# Patient Record
Sex: Female | Born: 1945 | Race: Black or African American | Hispanic: No | Marital: Married | Smoking: Never smoker
Health system: Southern US, Community
[De-identification: ages and names within clinical notes are randomized; demographics above are authoritative.]

## PROBLEM LIST (undated history)

## (undated) DIAGNOSIS — A048 Other specified bacterial intestinal infections: Secondary | ICD-10-CM

## (undated) DIAGNOSIS — C50919 Malignant neoplasm of unspecified site of unspecified female breast: Secondary | ICD-10-CM

## (undated) DIAGNOSIS — C50219 Malignant neoplasm of upper-inner quadrant of unspecified female breast: Secondary | ICD-10-CM

## (undated) DIAGNOSIS — K589 Irritable bowel syndrome without diarrhea: Secondary | ICD-10-CM

## (undated) DIAGNOSIS — K219 Gastro-esophageal reflux disease without esophagitis: Secondary | ICD-10-CM

## (undated) DIAGNOSIS — Z923 Personal history of irradiation: Secondary | ICD-10-CM

## (undated) DIAGNOSIS — C801 Malignant (primary) neoplasm, unspecified: Secondary | ICD-10-CM

## (undated) DIAGNOSIS — E785 Hyperlipidemia, unspecified: Secondary | ICD-10-CM

## (undated) DIAGNOSIS — R011 Cardiac murmur, unspecified: Secondary | ICD-10-CM

## (undated) DIAGNOSIS — N63 Unspecified lump in unspecified breast: Secondary | ICD-10-CM

## (undated) DIAGNOSIS — E559 Vitamin D deficiency, unspecified: Secondary | ICD-10-CM

## (undated) DIAGNOSIS — C50419 Malignant neoplasm of upper-outer quadrant of unspecified female breast: Secondary | ICD-10-CM

## (undated) DIAGNOSIS — Z8719 Personal history of other diseases of the digestive system: Secondary | ICD-10-CM

## (undated) DIAGNOSIS — Z9221 Personal history of antineoplastic chemotherapy: Secondary | ICD-10-CM

## (undated) DIAGNOSIS — M810 Age-related osteoporosis without current pathological fracture: Secondary | ICD-10-CM

## (undated) DIAGNOSIS — I10 Essential (primary) hypertension: Secondary | ICD-10-CM

## (undated) HISTORY — PX: HERNIA REPAIR: SHX51

## (undated) HISTORY — DX: Essential (primary) hypertension: I10

## (undated) HISTORY — DX: Cardiac murmur, unspecified: R01.1

## (undated) HISTORY — DX: Malignant neoplasm of upper-outer quadrant of unspecified female breast: C50.419

## (undated) HISTORY — PX: TUBAL LIGATION: SHX77

## (undated) HISTORY — PX: COLONOSCOPY: SHX174

## (undated) HISTORY — DX: Malignant neoplasm of upper-inner quadrant of unspecified female breast: C50.219

## (undated) HISTORY — PX: CHOLECYSTECTOMY: SHX55

## (undated) HISTORY — DX: Unspecified lump in unspecified breast: N63.0

## (undated) HISTORY — DX: Other specified bacterial intestinal infections: A04.8

## (undated) HISTORY — PX: BREAST EXCISIONAL BIOPSY: SUR124

## (undated) HISTORY — DX: Gastro-esophageal reflux disease without esophagitis: K21.9

## (undated) HISTORY — DX: Malignant (primary) neoplasm, unspecified: C80.1

---

## 1968-10-28 HISTORY — PX: DILATION AND CURETTAGE OF UTERUS: SHX78

## 2003-10-29 HISTORY — PX: UPPER GI ENDOSCOPY: SHX6162

## 2004-08-28 ENCOUNTER — Ambulatory Visit: Payer: Self-pay | Admitting: Gastroenterology

## 2005-04-02 ENCOUNTER — Ambulatory Visit: Payer: Self-pay | Admitting: Internal Medicine

## 2006-04-24 ENCOUNTER — Ambulatory Visit: Payer: Self-pay | Admitting: Internal Medicine

## 2006-12-13 ENCOUNTER — Ambulatory Visit: Payer: Self-pay | Admitting: Otolaryngology

## 2007-04-27 ENCOUNTER — Ambulatory Visit: Payer: Self-pay | Admitting: Internal Medicine

## 2007-09-11 ENCOUNTER — Ambulatory Visit: Payer: Self-pay | Admitting: Gastroenterology

## 2008-04-27 ENCOUNTER — Ambulatory Visit: Payer: Self-pay | Admitting: Unknown Physician Specialty

## 2008-04-27 HISTORY — PX: UPPER GI ENDOSCOPY: SHX6162

## 2008-04-28 ENCOUNTER — Ambulatory Visit: Payer: Self-pay | Admitting: Internal Medicine

## 2009-05-04 ENCOUNTER — Ambulatory Visit: Payer: Self-pay | Admitting: Internal Medicine

## 2009-08-29 ENCOUNTER — Ambulatory Visit: Payer: Self-pay | Admitting: Internal Medicine

## 2009-10-28 DIAGNOSIS — Z923 Personal history of irradiation: Secondary | ICD-10-CM

## 2009-10-28 DIAGNOSIS — C50919 Malignant neoplasm of unspecified site of unspecified female breast: Secondary | ICD-10-CM

## 2009-10-28 DIAGNOSIS — Z9221 Personal history of antineoplastic chemotherapy: Secondary | ICD-10-CM

## 2009-10-28 DIAGNOSIS — C801 Malignant (primary) neoplasm, unspecified: Secondary | ICD-10-CM

## 2009-10-28 HISTORY — DX: Personal history of irradiation: Z92.3

## 2009-10-28 HISTORY — DX: Malignant neoplasm of unspecified site of unspecified female breast: C50.919

## 2009-10-28 HISTORY — PX: BREAST SURGERY: SHX581

## 2009-10-28 HISTORY — PX: BREAST BIOPSY: SHX20

## 2009-10-28 HISTORY — DX: Malignant (primary) neoplasm, unspecified: C80.1

## 2009-10-28 HISTORY — DX: Personal history of antineoplastic chemotherapy: Z92.21

## 2010-07-24 ENCOUNTER — Ambulatory Visit: Payer: Self-pay | Admitting: Internal Medicine

## 2010-07-26 ENCOUNTER — Ambulatory Visit: Payer: Self-pay | Admitting: Internal Medicine

## 2010-07-30 ENCOUNTER — Ambulatory Visit: Payer: Self-pay | Admitting: Internal Medicine

## 2010-09-04 ENCOUNTER — Ambulatory Visit: Payer: Self-pay | Admitting: General Surgery

## 2010-10-04 ENCOUNTER — Ambulatory Visit: Payer: Self-pay | Admitting: General Surgery

## 2010-10-04 DIAGNOSIS — C50219 Malignant neoplasm of upper-inner quadrant of unspecified female breast: Secondary | ICD-10-CM

## 2010-10-04 HISTORY — PX: BREAST LUMPECTOMY: SHX2

## 2010-10-04 HISTORY — DX: Malignant neoplasm of upper-inner quadrant of unspecified female breast: C50.219

## 2010-10-05 LAB — PATHOLOGY REPORT

## 2010-10-15 ENCOUNTER — Ambulatory Visit: Payer: Self-pay | Admitting: Internal Medicine

## 2010-10-17 ENCOUNTER — Ambulatory Visit: Payer: Self-pay | Admitting: General Surgery

## 2010-10-28 ENCOUNTER — Ambulatory Visit: Payer: Self-pay | Admitting: Internal Medicine

## 2010-10-28 HISTORY — PX: OTHER SURGICAL HISTORY: SHX169

## 2010-11-12 ENCOUNTER — Ambulatory Visit: Payer: Self-pay | Admitting: General Surgery

## 2010-11-28 ENCOUNTER — Ambulatory Visit: Payer: Self-pay | Admitting: Internal Medicine

## 2010-12-27 ENCOUNTER — Ambulatory Visit: Payer: Self-pay | Admitting: Internal Medicine

## 2011-01-27 ENCOUNTER — Ambulatory Visit: Payer: Self-pay | Admitting: Internal Medicine

## 2011-02-26 ENCOUNTER — Ambulatory Visit: Payer: Self-pay | Admitting: Internal Medicine

## 2011-03-29 ENCOUNTER — Ambulatory Visit: Payer: Self-pay | Admitting: Internal Medicine

## 2011-04-28 ENCOUNTER — Ambulatory Visit: Payer: Self-pay | Admitting: Internal Medicine

## 2011-05-29 ENCOUNTER — Ambulatory Visit: Payer: Self-pay | Admitting: Internal Medicine

## 2011-07-02 ENCOUNTER — Ambulatory Visit: Payer: Self-pay | Admitting: Internal Medicine

## 2011-07-05 ENCOUNTER — Ambulatory Visit: Payer: Self-pay | Admitting: Unknown Physician Specialty

## 2011-07-29 ENCOUNTER — Ambulatory Visit: Payer: Self-pay | Admitting: Internal Medicine

## 2011-07-30 ENCOUNTER — Ambulatory Visit: Payer: Self-pay | Admitting: General Surgery

## 2011-08-29 ENCOUNTER — Ambulatory Visit: Payer: Self-pay | Admitting: Internal Medicine

## 2011-09-23 LAB — CANCER ANTIGEN 27.29: CA 27.29: 30.5 U/mL (ref 0.0–38.6)

## 2011-09-28 ENCOUNTER — Ambulatory Visit: Payer: Self-pay | Admitting: Internal Medicine

## 2011-11-01 ENCOUNTER — Ambulatory Visit: Payer: Self-pay | Admitting: Internal Medicine

## 2011-11-29 ENCOUNTER — Ambulatory Visit: Payer: Self-pay | Admitting: Internal Medicine

## 2011-12-27 ENCOUNTER — Ambulatory Visit: Payer: Self-pay | Admitting: Internal Medicine

## 2012-01-27 ENCOUNTER — Ambulatory Visit: Payer: Self-pay | Admitting: Internal Medicine

## 2012-01-27 HISTORY — PX: PORT-A-CATH REMOVAL: SHX5289

## 2012-01-27 LAB — HEPATIC FUNCTION PANEL A (ARMC)
Albumin: 3.8 g/dL (ref 3.4–5.0)
Alkaline Phosphatase: 113 U/L (ref 50–136)
Bilirubin, Direct: 0.1 mg/dL (ref 0.00–0.20)
Bilirubin,Total: 0.3 mg/dL (ref 0.2–1.0)
SGOT(AST): 24 U/L (ref 15–37)
SGPT (ALT): 27 U/L
Total Protein: 7.3 g/dL (ref 6.4–8.2)

## 2012-01-27 LAB — CBC CANCER CENTER
Basophil %: 1 %
Eosinophil #: 0 x10 3/mm (ref 0.0–0.7)
Eosinophil %: 0.4 %
HCT: 39.4 % (ref 35.0–47.0)
HGB: 13.6 g/dL (ref 12.0–16.0)
MCH: 31.1 pg (ref 26.0–34.0)
MCHC: 34.4 g/dL (ref 32.0–36.0)
Monocyte #: 0.5 x10 3/mm (ref 0.0–0.7)
Monocyte %: 9 %
Neutrophil #: 3.4 x10 3/mm (ref 1.4–6.5)
Neutrophil %: 61.8 %
Platelet: 203 x10 3/mm (ref 150–440)

## 2012-01-27 LAB — CREATININE, SERUM: Creatinine: 0.75 mg/dL (ref 0.60–1.30)

## 2012-01-28 LAB — CANCER ANTIGEN 27.29: CA 27.29: 29.4 U/mL (ref 0.0–38.6)

## 2012-01-30 ENCOUNTER — Ambulatory Visit: Payer: Self-pay | Admitting: General Surgery

## 2012-02-26 ENCOUNTER — Ambulatory Visit: Payer: Self-pay | Admitting: Internal Medicine

## 2012-07-13 ENCOUNTER — Ambulatory Visit: Payer: Self-pay | Admitting: Internal Medicine

## 2012-07-13 LAB — CBC CANCER CENTER
Basophil #: 0 x10 3/mm (ref 0.0–0.1)
Basophil %: 0.6 %
Eosinophil #: 0 x10 3/mm (ref 0.0–0.7)
Eosinophil %: 0.7 %
HCT: 42.8 % (ref 35.0–47.0)
HGB: 13.8 g/dL (ref 12.0–16.0)
Lymphocyte %: 30.4 %
MCH: 29.8 pg (ref 26.0–34.0)
MCHC: 32.2 g/dL (ref 32.0–36.0)
MCV: 92 fL (ref 80–100)
Monocyte %: 8.8 %
Neutrophil #: 3 x10 3/mm (ref 1.4–6.5)
RBC: 4.64 10*6/uL (ref 3.80–5.20)
RDW: 14.6 % — ABNORMAL HIGH (ref 11.5–14.5)
WBC: 5.1 x10 3/mm (ref 3.6–11.0)

## 2012-07-13 LAB — HEPATIC FUNCTION PANEL A (ARMC)
Albumin: 4 g/dL (ref 3.4–5.0)
Alkaline Phosphatase: 116 U/L (ref 50–136)
SGOT(AST): 29 U/L (ref 15–37)
SGPT (ALT): 33 U/L (ref 12–78)

## 2012-07-13 LAB — CREATININE, SERUM
EGFR (African American): >60
EGFR (Non-African Amer.): >60

## 2012-07-14 LAB — CANCER ANTIGEN 27.29: CA 27.29: 35.5 U/mL (ref 0.0–38.6)

## 2012-07-28 ENCOUNTER — Ambulatory Visit: Payer: Self-pay | Admitting: Internal Medicine

## 2012-08-03 ENCOUNTER — Ambulatory Visit: Payer: Self-pay | Admitting: General Surgery

## 2013-02-08 ENCOUNTER — Ambulatory Visit: Payer: Self-pay | Admitting: Internal Medicine

## 2013-02-10 LAB — CBC CANCER CENTER
Basophil %: 0.6 %
Eosinophil #: 0 x10 3/mm (ref 0.0–0.7)
HCT: 42.5 % (ref 35.0–47.0)
HGB: 14.2 g/dL (ref 12.0–16.0)
Lymphocyte #: 2 x10 3/mm (ref 1.0–3.6)
Lymphocyte %: 31.6 %
MCH: 30 pg (ref 26.0–34.0)
MCHC: 33.3 g/dL (ref 32.0–36.0)
MCV: 90 fL (ref 80–100)
Monocyte #: 0.6 x10 3/mm (ref 0.2–0.9)
Neutrophil #: 3.7 x10 3/mm (ref 1.4–6.5)
Neutrophil %: 58.2 %
Platelet: 227 x10 3/mm (ref 150–440)
RBC: 4.72 10*6/uL (ref 3.80–5.20)
RDW: 14.2 % (ref 11.5–14.5)

## 2013-02-10 LAB — HEPATIC FUNCTION PANEL A (ARMC)
Albumin: 4 g/dL (ref 3.4–5.0)
Alkaline Phosphatase: 125 U/L (ref 50–136)
SGPT (ALT): 27 U/L (ref 12–78)
Total Protein: 8.2 g/dL (ref 6.4–8.2)

## 2013-02-10 LAB — CREATININE, SERUM
Creatinine: 1.02 mg/dL (ref 0.60–1.30)
EGFR (Non-African Amer.): 58 — ABNORMAL LOW

## 2013-02-25 ENCOUNTER — Ambulatory Visit: Payer: Self-pay | Admitting: Internal Medicine

## 2013-03-29 ENCOUNTER — Encounter: Payer: Self-pay | Admitting: *Deleted

## 2013-05-09 ENCOUNTER — Ambulatory Visit: Payer: Self-pay | Admitting: Family Medicine

## 2013-05-09 ENCOUNTER — Ambulatory Visit: Payer: Self-pay | Admitting: Emergency Medicine

## 2013-05-09 LAB — CBC WITH DIFFERENTIAL/PLATELET
Basophil #: 0 10*3/uL (ref 0.0–0.1)
Basophil %: 0.5 %
Eosinophil #: 0 10*3/uL (ref 0.0–0.7)
Eosinophil %: 0.2 %
HCT: 43.5 % (ref 35.0–47.0)
HGB: 14.8 g/dL (ref 12.0–16.0)
Lymphocyte #: 1.4 10*3/uL (ref 1.0–3.6)
Lymphocyte %: 23.8 %
MCH: 30.3 pg (ref 26.0–34.0)
MCHC: 34.1 g/dL (ref 32.0–36.0)
MCV: 89 fL (ref 80–100)
Monocyte #: 0.7 x10 3/mm (ref 0.2–0.9)
Monocyte %: 11 %
Neutrophil #: 3.9 10*3/uL (ref 1.4–6.5)
Neutrophil %: 64.5 %
Platelet: 244 10*3/uL (ref 150–440)
RBC: 4.89 10*6/uL (ref 3.80–5.20)
RDW: 14.7 % — ABNORMAL HIGH (ref 11.5–14.5)
WBC: 6.1 10*3/uL (ref 3.6–11.0)

## 2013-05-09 LAB — COMPREHENSIVE METABOLIC PANEL
Albumin: 4.4 g/dL (ref 3.4–5.0)
Alkaline Phosphatase: 135 U/L (ref 50–136)
Anion Gap: 12 (ref 7–16)
BUN: 11 mg/dL (ref 7–18)
Bilirubin,Total: 0.3 mg/dL (ref 0.2–1.0)
Calcium, Total: 9.9 mg/dL (ref 8.5–10.1)
Chloride: 103 mmol/L (ref 98–107)
Co2: 27 mmol/L (ref 21–32)
Creatinine: 0.83 mg/dL (ref 0.60–1.30)
EGFR (African American): >60
EGFR (Non-African Amer.): >60
Glucose: 80 mg/dL (ref 65–99)
Osmolality: 281 (ref 275–301)
Potassium: 4 mmol/L (ref 3.5–5.1)
SGOT(AST): 24 U/L (ref 15–37)
SGPT (ALT): 30 U/L (ref 12–78)
Sodium: 142 mmol/L (ref 136–145)
Total Protein: 8.9 g/dL — ABNORMAL HIGH (ref 6.4–8.2)

## 2013-05-09 LAB — URINALYSIS, COMPLETE
Glucose,UR: NEGATIVE mg/dL (ref 0–75)
Ketone: NEGATIVE
Ph: 6.5 (ref 4.5–8.0)
Protein: NEGATIVE
Specific Gravity: 1.005 (ref 1.003–1.030)
WBC UR: NONE SEEN /HPF (ref 0–5)

## 2013-08-12 ENCOUNTER — Ambulatory Visit: Payer: Self-pay | Admitting: General Surgery

## 2013-08-12 ENCOUNTER — Encounter: Payer: Self-pay | Admitting: General Surgery

## 2013-08-26 ENCOUNTER — Ambulatory Visit (INDEPENDENT_AMBULATORY_CARE_PROVIDER_SITE_OTHER): Payer: Medicare Other | Admitting: General Surgery

## 2013-08-26 ENCOUNTER — Encounter: Payer: Self-pay | Admitting: General Surgery

## 2013-08-26 VITALS — BP 146/88 | HR 70 | Resp 12 | Ht 62.0 in | Wt 139.0 lb

## 2013-08-26 DIAGNOSIS — Z853 Personal history of malignant neoplasm of breast: Secondary | ICD-10-CM

## 2013-08-26 DIAGNOSIS — Z1239 Encounter for other screening for malignant neoplasm of breast: Secondary | ICD-10-CM

## 2013-08-26 NOTE — Progress Notes (Signed)
Patient ID: Cindy Ball, female   DOB: [birth year], 67 y.o.   MRN: [id number]  Chief Complaint  Patient presents with  . Follow-up    1 year follow up bilateral diagnostic mammogram. History of Breast Cancer    HPI Cindy Ball is a 67 y.o. female who presents for a breast evaluation. The most recent mammogram was done on 08/12/13 with a birad category 1. Patient does perform regular self breast checks and gets regular mammograms done.  The patient denies any new problems with the breasts at this time. She recently underwent some cardiac testing due to some episodes of her heart racing and her not feeling well. She is doing much better now.    HPI  Past Medical History  Diagnosis Date  . Hypertension   . Lump or mass in breast     L breast  . GERD (gastroesophageal reflux disease)   . Heart murmur   . Cancer 2011    L-Breast  . 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 Malignant neoplasm of upper-outer quadrant of female breast     Past Surgical History  Procedure Laterality Date  . Cholecystectomy    . Tubal ligation    . Port-a-cath removal  01/2012  . Colonoscopy  2012    04/27/2008: Adenomatous polyp from the hepatic flexure and a tubular adenoma in the rectum.  Marland Kitchen Upper gi endoscopy  2005  . Dilation and curettage of uterus  1970  . Mammosite placement  2012  . Mammosite removal  2012  . Breast surgery Left 2011    Lumpectomy  . 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  Marland Kitchen Upper gi endoscopy  04/27/2008    Lynnae Prude, M.D.: Moderate active gastritis, H. pylori not identified.    Family History  Problem Relation Age of Onset  . Breast cancer Maternal Grandmother 90  . Cancer Maternal Grandmother     breast cancer, age greater than 55    Social History History  Substance Use Topics  . Smoking status: Never Smoker   . Smokeless tobacco:  Never Used  . Alcohol Use: No    No Known Allergies  Current Outpatient Prescriptions  Medication Sig Dispense Refill  . aspirin 81 MG tablet Take 81 mg by mouth daily.      . metoprolol succinate (TOPROL-XL) 25 MG 24 hr tablet Take 1 tablet by mouth daily.      Marland Kitchen omeprazole (PRILOSEC) 20 MG capsule Take 1 capsule by mouth 2 (two) times daily.       No current facility-administered medications for this visit.    Review of Systems Review of Systems  Constitutional: Negative.   Respiratory: Negative.   Cardiovascular: Negative.     Blood pressure 146/88, pulse 70, resp. rate 12, height 5\' 2"  (1.575 m), weight 139 lb (63.05 kg).  Physical Exam Physical Exam  Constitutional: She is oriented to person, place, and time. She appears well-developed and well-nourished.  Neck: No thyromegaly present.  Cardiovascular: Normal rate, regular rhythm and normal heart sounds.   No murmur heard. Pulmonary/Chest: Effort normal and breath sounds normal. Right breast exhibits no inverted nipple, no mass, no nipple discharge, no skin change and no tenderness. Left breast exhibits no inverted nipple, no mass, no nipple discharge, no skin change and no tenderness.  Well healed scar on the upper inner quadrant of the right breast. A  little thickening present at the scar.   Well healed transverse scar in the left breast in the upper inner quadrant.   Lymphadenopathy:    She has no cervical adenopathy.    She has no axillary adenopathy.  Neurological: She is alert and oriented to person, place, and time.  Skin: Skin is warm and dry.    Data Reviewed Bilateral mammograms is 08/12/2013 reviewed. No interval change. BI-RAD-1.  Assessment    Stable exam now 3 years status post management of a left breast malignancy.    Plan    Followup examination with bilateral mammograms in one year.       Earline Mayotte 08/28/2013, 11:14 AM

## 2013-08-26 NOTE — Patient Instructions (Signed)
Patient to return in 1 year with bilateral diagnostic mammogram.

## 2013-08-28 ENCOUNTER — Encounter: Payer: Self-pay | Admitting: General Surgery

## 2013-09-13 ENCOUNTER — Encounter: Payer: Self-pay | Admitting: General Surgery

## 2014-01-22 ENCOUNTER — Ambulatory Visit: Payer: Self-pay | Admitting: Family Medicine

## 2014-01-23 ENCOUNTER — Ambulatory Visit: Payer: Self-pay | Admitting: Family Medicine

## 2014-01-23 LAB — CBC WITH DIFFERENTIAL/PLATELET
BASOS ABS: 0 10*3/uL (ref 0.0–0.1)
Basophil %: 0.1 %
EOS ABS: 0 10*3/uL (ref 0.0–0.7)
Eosinophil %: 0.3 %
HCT: 43.1 % (ref 35.0–47.0)
HGB: 14.3 g/dL (ref 12.0–16.0)
LYMPHS ABS: 1.3 10*3/uL (ref 1.0–3.6)
Lymphocyte %: 17.8 %
MCH: 30.1 pg (ref 26.0–34.0)
MCHC: 33.1 g/dL (ref 32.0–36.0)
MCV: 91 fL (ref 80–100)
Monocyte #: 0.3 x10 3/mm (ref 0.2–0.9)
Monocyte %: 4.6 %
NEUTROS ABS: 5.7 10*3/uL (ref 1.4–6.5)
NEUTROS PCT: 77.2 %
Platelet: 224 10*3/uL (ref 150–440)
RBC: 4.73 10*6/uL (ref 3.80–5.20)
RDW: 15 % — AB (ref 11.5–14.5)
WBC: 7.4 10*3/uL (ref 3.6–11.0)

## 2014-01-24 ENCOUNTER — Ambulatory Visit: Payer: Self-pay

## 2014-02-08 ENCOUNTER — Ambulatory Visit: Payer: Self-pay | Admitting: Internal Medicine

## 2014-02-09 LAB — CBC CANCER CENTER
Basophil #: 0.1 x10 3/mm (ref 0.0–0.1)
Basophil %: 1 %
Eosinophil #: 0 x10 3/mm (ref 0.0–0.7)
Eosinophil %: 0.4 %
HCT: 43.9 % (ref 35.0–47.0)
HGB: 14.3 g/dL (ref 12.0–16.0)
LYMPHS PCT: 26.5 %
Lymphocyte #: 1.9 x10 3/mm (ref 1.0–3.6)
MCH: 29.6 pg (ref 26.0–34.0)
MCHC: 32.5 g/dL (ref 32.0–36.0)
MCV: 91 fL (ref 80–100)
Monocyte #: 0.7 x10 3/mm (ref 0.2–0.9)
Monocyte %: 9.9 %
Neutrophil #: 4.5 x10 3/mm (ref 1.4–6.5)
Neutrophil %: 62.2 %
Platelet: 250 x10 3/mm (ref 150–440)
RBC: 4.82 10*6/uL (ref 3.80–5.20)
RDW: 14.9 % — ABNORMAL HIGH (ref 11.5–14.5)
WBC: 7.3 x10 3/mm (ref 3.6–11.0)

## 2014-02-09 LAB — HEPATIC FUNCTION PANEL A (ARMC)
ALK PHOS: 112 U/L
Albumin: 4.1 g/dL (ref 3.4–5.0)
Bilirubin, Direct: 0.1 mg/dL (ref 0.00–0.20)
Bilirubin,Total: 0.3 mg/dL (ref 0.2–1.0)
SGOT(AST): 23 U/L (ref 15–37)
SGPT (ALT): 34 U/L (ref 12–78)
TOTAL PROTEIN: 8.5 g/dL — AB (ref 6.4–8.2)

## 2014-02-09 LAB — CREATININE, SERUM
CREATININE: 1.18 mg/dL (ref 0.60–1.30)
EGFR (Non-African Amer.): 48 — ABNORMAL LOW
GFR CALC AF AMER: 56 — AB

## 2014-02-10 LAB — CANCER ANTIGEN 27.29: CA 27.29: 33.8 U/mL (ref 0.0–38.6)

## 2014-02-25 ENCOUNTER — Ambulatory Visit: Payer: Self-pay | Admitting: Internal Medicine

## 2014-08-08 ENCOUNTER — Encounter: Payer: Self-pay | Admitting: General Surgery

## 2014-08-16 ENCOUNTER — Ambulatory Visit (INDEPENDENT_AMBULATORY_CARE_PROVIDER_SITE_OTHER): Payer: Medicare Other | Admitting: General Surgery

## 2014-08-16 ENCOUNTER — Encounter: Payer: Self-pay | Admitting: General Surgery

## 2014-08-16 VITALS — BP 130/76 | HR 74 | Resp 12 | Ht 62.0 in | Wt 139.0 lb

## 2014-08-16 DIAGNOSIS — Z853 Personal history of malignant neoplasm of breast: Secondary | ICD-10-CM

## 2014-08-16 NOTE — Patient Instructions (Addendum)
Continue self breast exams. Call office for any new breast issues or concerns. Patient to return in one year with bilateral diagnostic mammogram. 

## 2014-08-16 NOTE — Progress Notes (Signed)
Patient ID: Cindy Ball, female   DOB: 28-Feb-1946, 68 y.o.   MRN: [id number]  Chief Complaint  Patient presents with  . Follow-up    mammogram    HPI Cindy Ball is a 68 y.o. female who presents for her follow up breast cancer and breast evaluation. The most recent mammogram was done on 08/05/14.  Patient does perform regular self breast checks and gets regular mammograms done.  No new breast issues.  HPI  Past Medical History  Diagnosis Date  . Hypertension   . Lump or mass in breast     L breast  . GERD (gastroesophageal reflux disease)   . Heart murmur   . Cancer 2011    L-Breast  . 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 Malignant neoplasm of upper-outer quadrant of female breast     Past Surgical History  Procedure Laterality Date  . Cholecystectomy    . Tubal ligation    . Port-a-cath removal  01/2012  . Colonoscopy  2012    04/27/2008: Adenomatous polyp from the hepatic flexure and a tubular adenoma in the rectum.  Marland Kitchen Upper gi endoscopy  2005  . Dilation and curettage of uterus  1970  . Mammosite placement  2012  . Mammosite removal  2012  . Breast surgery Left 2011    Lumpectomy  . 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  Marland Kitchen Upper gi endoscopy  04/27/2008    Gaylyn Cheers, M.D.: Moderate active gastritis, H. pylori not identified.    Family History  Problem Relation Age of Onset  . Breast cancer Maternal Grandmother 90  . Cancer Maternal Grandmother     breast cancer, age greater than 33    Social History History  Substance Use Topics  . Smoking status: Never Smoker   . Smokeless tobacco: Never Used  . Alcohol Use: No    No Known Allergies  Current Outpatient Prescriptions  Medication Sig Dispense Refill  . metoprolol succinate (TOPROL-XL) 25 MG 24 hr tablet Take 1 tablet by mouth daily.      Marland Kitchen omeprazole (PRILOSEC) 20 MG  capsule Take 1 capsule by mouth 2 (two) times daily.      Marland Kitchen aspirin 81 MG tablet Take 81 mg by mouth daily.       No current facility-administered medications for this visit.    Review of Systems Review of Systems  Constitutional: Negative.   Respiratory: Negative.   Cardiovascular: Negative.     Blood pressure 130/76, pulse 74, resp. rate 12, height 5\' 2"  (1.575 m), weight 139 lb (63.05 kg).  Physical Exam Physical Exam  Constitutional: She is oriented to person, place, and time. She appears well-developed and well-nourished.  Neck: Neck supple.  Cardiovascular: Normal rate, regular rhythm and normal heart sounds.   Pulmonary/Chest: Effort normal and breath sounds normal. Right breast exhibits no inverted nipple, no mass, no nipple discharge, no skin change and no tenderness. Left breast exhibits no inverted nipple, no mass, no nipple discharge, no skin change and no tenderness.  Well healed incision 12 to 2 left breast  Lymphadenopathy:    She has no cervical adenopathy.    She has no axillary adenopathy.  Neurological: She is alert and oriented to person, place, and time.  Skin: Skin is warm and dry.    Data Reviewed Bilateral mammograms dated 08/05/2014 completed at [facility] showed expected changes in  the treated breast, left common 12:00. No interval change suggestive of malignancy. BI-RAD-2.  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  History of colonic polyp (2009) with negative endoscopy in 2012 by Gaylyn Cheers M.D.    Plan    We'll plan for a followup examination with bilateral mammograms in one year.  Patient to return in one year with bilateral diagnostic mammogram.    PCP: Ezequiel Kayser Gaetano Net NP)    Robert Bellow 08/16/2014, 2:52 PM

## 2014-08-29 ENCOUNTER — Encounter: Payer: Self-pay | Admitting: General Surgery

## 2015-02-10 ENCOUNTER — Ambulatory Visit
Admit: 2015-02-10 | Disposition: A | Discharge status: Home / Self Care | Payer: Self-pay | Attending: Internal Medicine | Admitting: Internal Medicine

## 2015-02-10 LAB — HEPATIC FUNCTION PANEL A (ARMC)
ALBUMIN: 4.3 g/dL
Alkaline Phosphatase: 101 U/L
BILIRUBIN TOTAL: 0.5 mg/dL
SGOT(AST): 27 U/L
SGPT (ALT): 21 U/L
Total Protein: 8.2 g/dL — ABNORMAL HIGH

## 2015-02-10 LAB — CBC CANCER CENTER
BASOS ABS: 0 x10 3/mm (ref 0.0–0.1)
BASOS PCT: 0.5 %
Eosinophil #: 0 x10 3/mm (ref 0.0–0.7)
Eosinophil %: 0.7 %
HCT: 42.7 % (ref 35.0–47.0)
HGB: 14.2 g/dL (ref 12.0–16.0)
LYMPHS PCT: 40.4 %
Lymphocyte #: 2.6 x10 3/mm (ref 1.0–3.6)
MCH: 29.7 pg (ref 26.0–34.0)
MCHC: 33.2 g/dL (ref 32.0–36.0)
MCV: 90 fL (ref 80–100)
MONOS PCT: 7.4 %
Monocyte #: 0.5 x10 3/mm (ref 0.2–0.9)
NEUTROS PCT: 51 %
Neutrophil #: 3.2 x10 3/mm (ref 1.4–6.5)
Platelet: 217 x10 3/mm (ref 150–440)
RBC: 4.77 10*6/uL (ref 3.80–5.20)
RDW: 14.8 % — ABNORMAL HIGH (ref 11.5–14.5)
WBC: 6.4 x10 3/mm (ref 3.6–11.0)

## 2015-02-10 LAB — CREATININE, SERUM: Creatinine: 0.9 mg/dL

## 2015-02-11 LAB — CANCER ANTIGEN 27.29: CA 27.29: 29.2 U/mL (ref 0.0–38.6)

## 2015-02-26 DIAGNOSIS — A048 Other specified bacterial intestinal infections: Secondary | ICD-10-CM

## 2015-02-26 HISTORY — DX: Other specified bacterial intestinal infections: A04.8

## 2015-05-05 ENCOUNTER — Inpatient Hospital Stay: Payer: Medicare Other | Attending: Internal Medicine | Admitting: Internal Medicine

## 2015-05-05 VITALS — Ht 62.0 in | Wt 134.3 lb

## 2015-05-05 DIAGNOSIS — Z713 Dietary counseling and surveillance: Secondary | ICD-10-CM

## 2015-05-05 DIAGNOSIS — Z171 Estrogen receptor negative status [ER-]: Secondary | ICD-10-CM

## 2015-05-05 DIAGNOSIS — Z853 Personal history of malignant neoplasm of breast: Secondary | ICD-10-CM

## 2015-05-05 DIAGNOSIS — R634 Abnormal weight loss: Secondary | ICD-10-CM

## 2015-05-05 DIAGNOSIS — C50912 Malignant neoplasm of unspecified site of left female breast: Secondary | ICD-10-CM

## 2015-05-05 NOTE — Progress Notes (Signed)
Patient returned for weight monitoring only today since she had some unintentional weight loss recently. She weighs 134 pounds today which is up 2 pounds since April 15 (was 132.8 then). Serum CA 27-29 level also was normal in April. Plan is continued surveillance, will keep scheduled appointment the same.

## 2015-06-05 DIAGNOSIS — R002 Palpitations: Secondary | ICD-10-CM | POA: Insufficient documentation

## 2015-08-09 ENCOUNTER — Encounter: Payer: Self-pay | Admitting: General Surgery

## 2015-08-21 ENCOUNTER — Encounter: Payer: Self-pay | Admitting: General Surgery

## 2015-08-21 ENCOUNTER — Ambulatory Visit (INDEPENDENT_AMBULATORY_CARE_PROVIDER_SITE_OTHER): Payer: Medicare Other | Admitting: General Surgery

## 2015-08-21 VITALS — BP 136/76 | HR 72 | Resp 12 | Ht 62.0 in | Wt 137.0 lb

## 2015-08-21 DIAGNOSIS — Z853 Personal history of malignant neoplasm of breast: Secondary | ICD-10-CM

## 2015-08-21 NOTE — Patient Instructions (Signed)
The patient has been asked to return to the office in one year with a bilateral diagnostic mammogram. 

## 2015-08-21 NOTE — Progress Notes (Addendum)
Patient ID: Cindy Ball, female   DOB: [birth year], 69 y.o.   MRN: [id number]  Chief Complaint  Patient presents with  . Follow-up    mammmogram    HPI Cindy Ball is a 69 y.o. female who presents for a breast evaluation. The most recent mammogram was done on 08/08/15.  Patient does perform regular self breast checks and gets regular mammograms done.  Patient states no new breast issues. She did have a H. Pylori infection in May 2016 which was treated with antibiotics.   I personally confirm the above clinical history with the patient.  HPI  Past Medical History  Diagnosis Date  . Hypertension   . Lump or mass in breast     L breast  . GERD (gastroesophageal reflux disease)   . Heart murmur   . Cancer [facility]) 2011    L-Breast  . 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 Malignant neoplasm of upper-outer quadrant of female breast ([place])   . Helicobacter pylori infection 02/2015    Past Surgical History  Procedure Laterality Date  . Cholecystectomy    . Tubal ligation    . Port-a-cath removal  01/2012  . Colonoscopy  2012, Keith Rake, MD    04/27/2008: Adenomatous polyp from the hepatic flexure and a tubular adenoma in the rectum.  Marland Kitchen Upper gi endoscopy  2005  . Dilation and curettage of uterus  1970  . Mammosite placement  2012  . Mammosite removal  2012  . Breast surgery Left 2011    Lumpectomy  . 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  Marland Kitchen Upper gi endoscopy  04/27/2008    Gaylyn Cheers, M.D.: Moderate active gastritis, H. pylori not identified.    Family History  Problem Relation Age of Onset  . Breast cancer Maternal Grandmother 90  . Cancer Maternal Grandmother     breast cancer, age greater than 74    Social History Social History  Substance Use Topics  . Smoking status: Never Smoker   . Smokeless tobacco: Never Used  . Alcohol  Use: No    Allergies  Allergen Reactions  . Clindamycin/Lincomycin Palpitations    Current Outpatient Prescriptions  Medication Sig Dispense Refill  . aspirin 81 MG tablet Take 81 mg by mouth daily.    . metoprolol succinate (TOPROL-XL) 25 MG 24 hr tablet Take 1 tablet by mouth daily.    Marland Kitchen omeprazole (PRILOSEC) 20 MG capsule Take 1 capsule by mouth 2 (two) times daily.     No current facility-administered medications for this visit.    Review of Systems Review of Systems  Constitutional: Negative.   Respiratory: Negative.   Cardiovascular: Negative.     Blood pressure 136/76, pulse 72, resp. rate 12, height 5\' 2"  (1.575 m), weight 137 lb (62.143 kg).  Physical Exam Physical Exam  Constitutional: She appears well-developed and well-nourished.  Eyes: Conjunctivae are normal. No scleral icterus.  Neck: Neck supple.  Cardiovascular: Normal rate.  An irregular rhythm present.  The patient's cardiac rhythm is quite variable. Not frankly irregularly irregular, 4-6 beat runs of sinus followed by several skipped beats.  Pulmonary/Chest: Effort normal and breath sounds normal. Right breast exhibits no inverted nipple, no mass, no nipple discharge, no skin change and no tenderness. Left breast exhibits no inverted nipple, no mass, no nipple discharge, no skin change and no tenderness. Breasts are asymmetrical (half  size cup difference ).    Well healed incision left breast at 12 o'clock Diffuse thickening below incision, stable.  Abdominal: Soft. Bowel sounds are normal. There is no tenderness.  Lymphadenopathy:    She has no cervical adenopathy.    She has no axillary adenopathy.  Neurological: She is alert.  Skin: Skin is warm and dry.    Data Reviewed Bilateral diagnostic mammograms completed at [facility] dated 08/08/2015 were personally reviewed. No interval change. Surgical scarring. BI-RADS-2.  Assessment    Doing well now 5 years out from breast conserving  surgery.  Irregular cardiac rhythm, follow-up exam previously scheduled with Bartholome Bill, M.D.    Plan        The patient has been asked to return to the office in one year with a bilateral diagnostic mammogram.  PCP:  Berton Bon 08/30/2015, 11:26 AM

## 2016-02-13 ENCOUNTER — Inpatient Hospital Stay: Payer: Medicare Other

## 2016-02-13 ENCOUNTER — Inpatient Hospital Stay: Payer: Medicare Other | Attending: Family Medicine | Admitting: Family Medicine

## 2016-02-13 VITALS — BP 171/79 | HR 61 | Temp 97.7°F | Wt 138.0 lb

## 2016-02-13 DIAGNOSIS — C50919 Malignant neoplasm of unspecified site of unspecified female breast: Secondary | ICD-10-CM

## 2016-02-13 DIAGNOSIS — Z171 Estrogen receptor negative status [ER-]: Secondary | ICD-10-CM | POA: Diagnosis not present

## 2016-02-13 DIAGNOSIS — Z853 Personal history of malignant neoplasm of breast: Secondary | ICD-10-CM | POA: Diagnosis present

## 2016-02-13 DIAGNOSIS — E559 Vitamin D deficiency, unspecified: Secondary | ICD-10-CM | POA: Insufficient documentation

## 2016-02-13 DIAGNOSIS — I10 Essential (primary) hypertension: Secondary | ICD-10-CM | POA: Diagnosis not present

## 2016-02-13 DIAGNOSIS — C50412 Malignant neoplasm of upper-outer quadrant of left female breast: Secondary | ICD-10-CM

## 2016-02-13 DIAGNOSIS — E785 Hyperlipidemia, unspecified: Secondary | ICD-10-CM | POA: Insufficient documentation

## 2016-02-13 DIAGNOSIS — Z9221 Personal history of antineoplastic chemotherapy: Secondary | ICD-10-CM | POA: Insufficient documentation

## 2016-02-13 DIAGNOSIS — K219 Gastro-esophageal reflux disease without esophagitis: Secondary | ICD-10-CM | POA: Diagnosis not present

## 2016-02-13 DIAGNOSIS — I499 Cardiac arrhythmia, unspecified: Secondary | ICD-10-CM

## 2016-02-13 DIAGNOSIS — Z79899 Other long term (current) drug therapy: Secondary | ICD-10-CM | POA: Insufficient documentation

## 2016-02-13 DIAGNOSIS — K449 Diaphragmatic hernia without obstruction or gangrene: Secondary | ICD-10-CM | POA: Insufficient documentation

## 2016-02-13 DIAGNOSIS — R7303 Prediabetes: Secondary | ICD-10-CM | POA: Insufficient documentation

## 2016-02-13 LAB — COMPREHENSIVE METABOLIC PANEL
ALK PHOS: 94 U/L (ref 38–126)
ALT: 19 U/L (ref 14–54)
ANION GAP: 7 (ref 5–15)
AST: 24 U/L (ref 15–41)
Albumin: 4.3 g/dL (ref 3.5–5.0)
BUN: 16 mg/dL (ref 6–20)
CALCIUM: 9.8 mg/dL (ref 8.9–10.3)
CO2: 28 mmol/L (ref 22–32)
Chloride: 106 mmol/L (ref 101–111)
Creatinine, Ser: 0.87 mg/dL (ref 0.44–1.00)
GFR calc non Af Amer: >60 mL/min (ref >60)
GLUCOSE: 91 mg/dL (ref 65–99)
Potassium: 3.9 mmol/L (ref 3.5–5.1)
Sodium: 141 mmol/L (ref 135–145)
Total Bilirubin: 0.4 mg/dL (ref 0.3–1.2)
Total Protein: 7.8 g/dL (ref 6.5–8.1)

## 2016-02-13 LAB — CBC WITH DIFFERENTIAL/PLATELET
Basophils Absolute: 0 10*3/uL (ref 0–0.1)
Basophils Relative: 1 %
Eosinophils Absolute: 0 10*3/uL (ref 0–0.7)
Eosinophils Relative: 1 %
HEMATOCRIT: 42.1 % (ref 35.0–47.0)
Hemoglobin: 14.2 g/dL (ref 12.0–16.0)
LYMPHS ABS: 2.3 10*3/uL (ref 1.0–3.6)
LYMPHS PCT: 41 %
MCH: 30.1 pg (ref 26.0–34.0)
MCHC: 33.8 g/dL (ref 32.0–36.0)
MCV: 89 fL (ref 80.0–100.0)
MONOS PCT: 11 %
Monocytes Absolute: 0.6 10*3/uL (ref 0.2–0.9)
NEUTROS ABS: 2.7 10*3/uL (ref 1.4–6.5)
Neutrophils Relative %: 46 %
Platelets: 196 10*3/uL (ref 150–440)
RBC: 4.72 MIL/uL (ref 3.80–5.20)
RDW: 14.8 % — ABNORMAL HIGH (ref 11.5–14.5)
WBC: 5.7 10*3/uL (ref 3.6–11.0)

## 2016-02-13 NOTE — Progress Notes (Signed)
[place]  Telephone:(336) [phone]  Fax:(336) [phone]     Cindy Ball DOB: 06-24-1946  MR#: [id number]  OZD#:[id number]  Patient Care Team: Ezequiel Kayser, MD as PCP - General (Internal Medicine) Robert Bellow, MD (General Surgery)  CHIEF COMPLAINT:  Chief Complaint  Patient presents with  . Breast cancer, left    INTERVAL HISTORY:  Patient is here for further follow-up and treatment consideration regarding carcinoma of left breast. 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 Patient completed 4 cycles of adjuvant chemotherapy with Cytoxan and Taxotere in March 2012. Patient continues to follow very closely with Dr. Bary Castilla and most recently had her mammogram in October 2016 that was reported as BI-RADS 2, benign. She is also continuing to follow-up with the nurse practitioner that works and Dr. Rhona Leavens office. She is also followed by Dr. Ubaldo Glassing, cardiology, for arrhythmia. Overall she reports feeling very well and denies any acute complaints.  REVIEW OF SYSTEMS:   Review of Systems  Constitutional: Negative for fever, chills, weight loss, malaise/fatigue and diaphoresis.  HENT: Negative.   Eyes: Negative.   Respiratory: Negative for cough, hemoptysis, sputum production, shortness of breath and wheezing.   Cardiovascular: Negative for chest pain, palpitations, orthopnea, claudication, leg swelling and PND.  Gastrointestinal: Negative for heartburn, nausea, vomiting, abdominal pain, diarrhea, constipation, blood in stool and melena.  Genitourinary: Negative.   Musculoskeletal: Negative.   Skin: Negative.   Neurological: Negative for dizziness, tingling, focal weakness, seizures and weakness.  Endo/Heme/Allergies: Does not bruise/bleed easily.  Psychiatric/Behavioral: Negative for depression. The patient is not nervous/anxious and does  not have insomnia.     As per HPI. Otherwise, a complete review of systems is negatve.  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 Tumor size 0.7 cm, grade 3, margins clear. 2 sentinel lymph nodes negative. ER/PR/HER-2/neu negative.  Patient completed 4 cycles adjuvant chemotherapy with Taxotere/Cytoxan (started 11/13/10, completed 01/15/11)     Malignant neoplasm of breast ([place])   02/13/2016 Initial Diagnosis Malignant neoplasm of breast [facility])    PAST MEDICAL HISTORY: Past Medical History  Diagnosis Date  . Hypertension   . Lump or mass in breast     L breast  . GERD (gastroesophageal reflux disease)   . Heart murmur   . Cancer [facility]) 2011    L-Breast  . 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 Malignant neoplasm of upper-outer quadrant of female breast ([place])   . Helicobacter pylori infection 02/2015    PAST SURGICAL HISTORY: Past Surgical History  Procedure Laterality Date  . Cholecystectomy    . Tubal ligation    . Port-a-cath removal  01/2012  . Colonoscopy  2012, Keith Rake, MD    04/27/2008: Adenomatous polyp from the hepatic flexure and a tubular adenoma in the rectum.  Marland Kitchen Upper gi endoscopy  2005  . Dilation and curettage of uterus  1970  . Mammosite placement  2012  . Mammosite removal  2012  . Breast surgery Left 2011    Lumpectomy  . 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  Marland Kitchen Upper gi endoscopy  04/27/2008    Gaylyn Cheers, M.D.:  Moderate active gastritis, H. pylori not identified.    FAMILY HISTORY Family History  Problem Relation Age of Onset  . Breast cancer Maternal Grandmother 90  . Cancer Maternal Grandmother     breast cancer, age greater than 41    GYNECOLOGIC HISTORY:  No  LMP recorded. Patient is postmenopausal.     ADVANCED DIRECTIVES:    HEALTH MAINTENANCE: Social History  Substance Use Topics  . Smoking status: Never Smoker   . Smokeless tobacco: Never Used  . Alcohol Use: No     Mammogram:October 20 216  Allergies  Allergen Reactions  . Clindamycin/Lincomycin Palpitations    Current Outpatient Prescriptions  Medication Sig Dispense Refill  . amLODipine (NORVASC) 5 MG tablet TAKE 1 TABLET (5 MG TOTAL) BY MOUTH ONCE DAILY.  1  . metoprolol succinate (TOPROL-XL) 25 MG 24 hr tablet Take by mouth.    Marland Kitchen omeprazole (PRILOSEC) 20 MG capsule TAKE 1 TABLET BY MOUTH TWICE DAILY AS DIRECTED.  1   No current facility-administered medications for this visit.    OBJECTIVE: BP 171/79 mmHg  Pulse 61  Temp(Src) 97.7 F (36.5 C) (Oral)  Wt 138 lb 0.1 oz (62.6 kg)   Body mass index is 25.24 kg/(m^2).    ECOG FS:0 - Asymptomatic  General: Well-developed, well-nourished, no acute distress. Eyes: Pink conjunctiva, anicteric sclera. HEENT: Normocephalic, moist mucous membranes, clear oropharnyx. Lungs: Clear to auscultation bilaterally. Heart: Regular rate, irregular rhythm. No rubs, murmurs, or gallops. Abdomen: Soft, nontender, nondistended. No organomegaly noted, normoactive bowel sounds. Breast: Breast palpated in a circular manner in the sitting and supine positions.  No masses or fullness palpated.  Axilla palpated in both positions with no masses or fullness palpated.  Musculoskeletal: No edema, cyanosis, or clubbing. Neuro: Alert, answering all questions appropriately. Cranial nerves grossly intact. Skin: No rashes or petechiae noted. Psych: Normal affect. Lymphatics: No cervical, clavicular, or axillary LAD.   LAB RESULTS:  Clinical Support on 02/13/2016  Component Date Value Ref Range Status  . WBC 02/13/2016 5.7  3.6 - 11.0 K/uL Final  . RBC 02/13/2016 4.72  3.80 - 5.20 MIL/uL Final  . Hemoglobin 02/13/2016 14.2  12.0 - 16.0 g/dL Final    . HCT 02/13/2016 42.1  35.0 - 47.0 % Final  . MCV 02/13/2016 89.0  80.0 - 100.0 fL Final  . MCH 02/13/2016 30.1  26.0 - 34.0 pg Final  . MCHC 02/13/2016 33.8  32.0 - 36.0 g/dL Final  . RDW 02/13/2016 14.8* 11.5 - 14.5 % Final  . Platelets 02/13/2016 196  150 - 440 K/uL Final  . Neutrophils Relative % 02/13/2016 46   Final  . Neutro Abs 02/13/2016 2.7  1.4 - 6.5 K/uL Final  . Lymphocytes Relative 02/13/2016 41   Final  . Lymphs Abs 02/13/2016 2.3  1.0 - 3.6 K/uL Final  . Monocytes Relative 02/13/2016 11   Final  . Monocytes Absolute 02/13/2016 0.6  0.2 - 0.9 K/uL Final  . Eosinophils Relative 02/13/2016 1   Final  . Eosinophils Absolute 02/13/2016 0.0  0 - 0.7 K/uL Final  . Basophils Relative 02/13/2016 1   Final  . Basophils Absolute 02/13/2016 0.0  0 - 0.1 K/uL Final  . Sodium 02/13/2016 141  135 - 145 mmol/L Final  . Potassium 02/13/2016 3.9  3.5 - 5.1 mmol/L Final  . Chloride 02/13/2016 106  101 - 111 mmol/L Final  . CO2 02/13/2016 28  22 - 32 mmol/L Final  . Glucose, Bld 02/13/2016 91  65 -  99 mg/dL Final  . BUN 02/13/2016 16  6 - 20 mg/dL Final  . Creatinine, Ser 02/13/2016 0.87  0.44 - 1.00 mg/dL Final  . Calcium 02/13/2016 9.8  8.9 - 10.3 mg/dL Final  . Total Protein 02/13/2016 7.8  6.5 - 8.1 g/dL Final  . Albumin 02/13/2016 4.3  3.5 - 5.0 g/dL Final  . AST 02/13/2016 24  15 - 41 U/L Final  . ALT 02/13/2016 19  14 - 54 U/L Final  . Alkaline Phosphatase 02/13/2016 94  38 - 126 U/L Final  . Total Bilirubin 02/13/2016 0.4  0.3 - 1.2 mg/dL Final  . GFR calc non Af Amer 02/13/2016 >60  >60 mL/min Final  . GFR calc Af Amer 02/13/2016 >60  >60 mL/min Final   Comment: (NOTE) The eGFR has been calculated using the CKD EPI equation. This calculation has not been validated in all clinical situations. eGFR's persistently <60 mL/min signify possible Chronic Kidney Disease.   . Anion gap 02/13/2016 7  5 - 15 Final    STUDIES: No results found.  ASSESSMENT:  Carcinoma of left  breast, stage IA, T1b N0 M0, triple negative disease.  PLAN:    1. Left breast cancer. Patient was previously seen by Dr. Ma Hillock. She is status post lumpectomy and sentinel lymph node study in December 2011 for an infiltrating ductal carcinoma of the left breast, triple negative disease. She also completed 4 cycles of Cytoxan and Taxotere adjuvant chemotherapy in March 2012. Clinically there is no evidence of recurrent or progressive disease. Her most recent mammogram was in October 2016 and reported as BI-RADS 2, benign. Patient continues with close follow-up with Dr. Tollie Pizza as well. CA 27.29 is pending from today. If CA 27.29 remains normal we will continue with routine follow-up in approximately one year.  Patient expressed understanding and was in agreement with this plan. She also understands that She can call clinic at any time with any questions, concerns, or complaints.   Dr. Oliva Bustard was available for consultation and review of plan of care for this patient.   Stage IA, T1b N0 M0.   Evlyn Kanner, NP   02/13/2016 12:52 PM

## 2016-02-14 LAB — CANCER ANTIGEN 27.29: CA 27.29: 33.7 U/mL (ref 0.0–38.6)

## 2016-02-20 ENCOUNTER — Other Ambulatory Visit: Payer: Self-pay | Admitting: Family Medicine

## 2016-06-26 ENCOUNTER — Other Ambulatory Visit: Payer: Self-pay | Admitting: General Surgery

## 2016-06-26 DIAGNOSIS — Z853 Personal history of malignant neoplasm of breast: Secondary | ICD-10-CM

## 2016-07-08 ENCOUNTER — Other Ambulatory Visit: Payer: Self-pay | Admitting: *Deleted

## 2016-07-08 ENCOUNTER — Inpatient Hospital Stay
Admission: RE | Admit: 2016-07-08 | Discharge: 2016-07-08 | Disposition: A | Discharge status: Home / Self Care | Payer: Self-pay | Source: Ambulatory Visit | Attending: *Deleted | Admitting: *Deleted

## 2016-07-08 DIAGNOSIS — Z9289 Personal history of other medical treatment: Secondary | ICD-10-CM

## 2016-07-09 ENCOUNTER — Encounter: Payer: Self-pay | Admitting: *Deleted

## 2016-08-08 ENCOUNTER — Encounter: Payer: Self-pay | Admitting: *Deleted

## 2016-08-09 ENCOUNTER — Ambulatory Visit
Admission: RE | Admit: 2016-08-09 | Discharge: 2016-08-09 | Disposition: A | Discharge status: Home / Self Care | Payer: Medicare Other | Source: Ambulatory Visit | Attending: General Surgery | Admitting: General Surgery

## 2016-08-09 ENCOUNTER — Other Ambulatory Visit: Payer: Self-pay | Admitting: General Surgery

## 2016-08-09 DIAGNOSIS — Z853 Personal history of malignant neoplasm of breast: Secondary | ICD-10-CM

## 2016-08-14 ENCOUNTER — Ambulatory Visit (INDEPENDENT_AMBULATORY_CARE_PROVIDER_SITE_OTHER): Payer: Medicare Other | Admitting: General Surgery

## 2016-08-14 ENCOUNTER — Encounter: Payer: Self-pay | Admitting: General Surgery

## 2016-08-14 VITALS — BP 128/74 | HR 78 | Resp 14 | Ht 62.0 in | Wt 141.0 lb

## 2016-08-14 DIAGNOSIS — Z853 Personal history of malignant neoplasm of breast: Secondary | ICD-10-CM | POA: Diagnosis not present

## 2016-08-14 NOTE — Patient Instructions (Signed)
The patient is aware to call back for any questions or concerns.  

## 2016-08-14 NOTE — Progress Notes (Signed)
Patient ID: Cindy Ball, female   DOB: 08-13-1946, 70 y.o.   MRN: [id number]  Chief Complaint  Patient presents with  . Follow-up    HPI Cindy Ball is a 70 y.o. female.  who presents for her follow up left breast cancer and a breast evaluation. The most recent mammogram was done on 08-09-16.  Patient does perform regular self breast checks and gets regular mammograms done.  No new breast issues.  The patient has been undergoing cardiac evaluation with Dr. Ubaldo Glassing. Colonoscopy and upper endoscopy scheduled for December with Dr. Vira Agar.   HPI  Past Medical History:  Diagnosis Date  . Cancer ([place]) 2011   L-Breast  . GERD (gastroesophageal reflux disease)   . Heart murmur   . Helicobacter pylori infection 02/2015  . Hypertension   . Lump or mass in breast    L breast  . 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 Malignant neoplasm of upper-outer quadrant of female breast [facility])     Past Surgical History:  Procedure Laterality Date  . 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  Marland Kitchen BREAST SURGERY Left 2011   Lumpectomy  . CHOLECYSTECTOMY    . COLONOSCOPY  2012, Keith Rake, MD   04/27/2008: Adenomatous polyp from the hepatic flexure and a tubular adenoma in the rectum.  Marland Kitchen DILATION AND CURETTAGE OF UTERUS  1970  . mammosite placement  2012  . mammosite removal  2012  . PORT-A-CATH REMOVAL  01/2012  . TUBAL LIGATION    . UPPER GI ENDOSCOPY  2005  . UPPER GI ENDOSCOPY  04/27/2008   Gaylyn Cheers, M.D.: Moderate active gastritis, H. pylori not identified.    Family History  Problem Relation Age of Onset  . Breast cancer Maternal Grandmother 90  . Cancer Maternal Grandmother     breast cancer, age greater than 49  . Lung cancer Father     Social History Social History  Substance Use Topics  . Smoking status: Never Smoker  . Smokeless tobacco:  Never Used  . Alcohol use No    Allergies  Allergen Reactions  . Clarithromycin Palpitations  . Clindamycin/Lincomycin     Pt states she can take    Current Outpatient Prescriptions  Medication Sig Dispense Refill  . amLODipine (NORVASC) 5 MG tablet TAKE 1 TABLET (5 MG TOTAL) BY MOUTH ONCE DAILY.  1  . metoprolol succinate (TOPROL-XL) 25 MG 24 hr tablet Take by mouth.    Marland Kitchen omeprazole (PRILOSEC) 20 MG capsule TAKE 1 TABLET BY MOUTH TWICE DAILY AS DIRECTED.  1   No current facility-administered medications for this visit.     Review of Systems Review of Systems  Constitutional: Negative.   Respiratory: Negative.   Cardiovascular: Negative.     Blood pressure 128/74, pulse 78, resp. rate 14, height 5\' 2"  (1.575 m), weight 141 lb (64 kg).  Physical Exam Physical Exam  Constitutional: She is oriented to person, place, and time. She appears well-developed and well-nourished.  HENT:  Mouth/Throat: Oropharynx is clear and moist.  Eyes: Conjunctivae are normal. No scleral icterus.  Neck: Neck supple.  Cardiovascular: Normal rate, regular rhythm and normal heart sounds.   Pulmonary/Chest: Effort normal and breath sounds normal. Right breast exhibits no inverted nipple, no mass, no nipple discharge, no skin change and no tenderness. Left breast exhibits no inverted nipple, no mass, no nipple discharge, no skin change  and no tenderness.  Thickening left breast  Lymphadenopathy:    She has no cervical adenopathy.    She has no axillary adenopathy.  Neurological: She is alert and oriented to person, place, and time.  Skin: Skin is warm and dry.  Psychiatric: Her behavior is normal.    Data Reviewed Bilateral mammograms dated 08/09/2016 were reviewed. No interval change. BI-RADS-2.   Assessment    Benign breast exam.    Plan         The patient has been asked to return to the office in one year with a bilateral diagnostic mammogram.  This information has been scribed by  Karie Fetch RN, BSN,BC.   Robert Bellow 08/16/2016, 2:37 PM

## 2016-08-20 ENCOUNTER — Ambulatory Visit: Payer: Medicare Other | Admitting: General Surgery

## 2016-10-11 ENCOUNTER — Encounter: Payer: Self-pay | Admitting: *Deleted

## 2016-10-14 ENCOUNTER — Encounter
Admission: RE | Disposition: A | Discharge status: Home / Self Care | Payer: Self-pay | Source: Ambulatory Visit | Attending: Unknown Physician Specialty

## 2016-10-14 ENCOUNTER — Ambulatory Visit: Payer: Medicare Other | Admitting: Anesthesiology

## 2016-10-14 ENCOUNTER — Ambulatory Visit
Admission: RE | Admit: 2016-10-14 | Discharge: 2016-10-14 | Disposition: A | Discharge status: Home / Self Care | Payer: Medicare Other | Source: Ambulatory Visit | Attending: Unknown Physician Specialty | Admitting: Unknown Physician Specialty

## 2016-10-14 ENCOUNTER — Encounter: Payer: Self-pay | Admitting: *Deleted

## 2016-10-14 DIAGNOSIS — K640 First degree hemorrhoids: Secondary | ICD-10-CM | POA: Diagnosis not present

## 2016-10-14 DIAGNOSIS — Z86010 Personal history of colonic polyps: Secondary | ICD-10-CM | POA: Diagnosis not present

## 2016-10-14 DIAGNOSIS — K2950 Unspecified chronic gastritis without bleeding: Secondary | ICD-10-CM | POA: Diagnosis not present

## 2016-10-14 DIAGNOSIS — Z1211 Encounter for screening for malignant neoplasm of colon: Secondary | ICD-10-CM | POA: Insufficient documentation

## 2016-10-14 DIAGNOSIS — R12 Heartburn: Secondary | ICD-10-CM | POA: Insufficient documentation

## 2016-10-14 DIAGNOSIS — Z853 Personal history of malignant neoplasm of breast: Secondary | ICD-10-CM | POA: Insufficient documentation

## 2016-10-14 DIAGNOSIS — K5730 Diverticulosis of large intestine without perforation or abscess without bleeding: Secondary | ICD-10-CM | POA: Diagnosis not present

## 2016-10-14 DIAGNOSIS — I10 Essential (primary) hypertension: Secondary | ICD-10-CM | POA: Insufficient documentation

## 2016-10-14 DIAGNOSIS — Z79899 Other long term (current) drug therapy: Secondary | ICD-10-CM | POA: Insufficient documentation

## 2016-10-14 HISTORY — PX: COLONOSCOPY WITH PROPOFOL: SHX5780

## 2016-10-14 HISTORY — DX: Irritable bowel syndrome, unspecified: K58.9

## 2016-10-14 HISTORY — DX: Personal history of other diseases of the digestive system: Z87.19

## 2016-10-14 HISTORY — DX: Age-related osteoporosis without current pathological fracture: M81.0

## 2016-10-14 HISTORY — DX: Vitamin D deficiency, unspecified: E55.9

## 2016-10-14 HISTORY — DX: Hyperlipidemia, unspecified: E78.5

## 2016-10-14 HISTORY — PX: ESOPHAGOGASTRODUODENOSCOPY (EGD) WITH PROPOFOL: SHX5813

## 2016-10-14 SURGERY — COLONOSCOPY WITH PROPOFOL
Anesthesia: General

## 2016-10-14 MED ORDER — PROPOFOL 500 MG/50ML IV EMUL
INTRAVENOUS | Status: DC | PRN
  Administered 2016-10-14: 75 ug/kg/min via INTRAVENOUS

## 2016-10-14 MED ORDER — PHENYLEPHRINE HCL 10 MG/ML IJ SOLN
INTRAMUSCULAR | Status: DC | PRN
  Administered 2016-10-14 (×4): 100 ug via INTRAVENOUS

## 2016-10-14 MED ORDER — LIDOCAINE HCL (PF) 2 % IJ SOLN
INTRAMUSCULAR | Status: DC | PRN
  Administered 2016-10-14: 50 mg

## 2016-10-14 MED ORDER — SODIUM CHLORIDE 0.9 % IV SOLN
INTRAVENOUS | Status: DC
  Administered 2016-10-14: 13:00:00 via INTRAVENOUS

## 2016-10-14 MED ORDER — PROPOFOL 10 MG/ML IV BOLUS
INTRAVENOUS | Status: DC | PRN
  Administered 2016-10-14: 20 mg via INTRAVENOUS
  Administered 2016-10-14: 30 mg via INTRAVENOUS

## 2016-10-14 MED ORDER — SODIUM CHLORIDE 0.9 % IV SOLN: INTRAVENOUS | Status: DC

## 2016-10-14 MED ORDER — MIDAZOLAM HCL 5 MG/5ML IJ SOLN
INTRAMUSCULAR | Status: DC | PRN
  Administered 2016-10-14: 1 mg via INTRAVENOUS

## 2016-10-14 MED ORDER — FENTANYL CITRATE (PF) 100 MCG/2ML IJ SOLN
INTRAMUSCULAR | Status: DC | PRN
  Administered 2016-10-14: 50 ug via INTRAVENOUS

## 2016-10-14 NOTE — Anesthesia Preprocedure Evaluation (Signed)
Anesthesia Evaluation  Patient identified by MRN, date of birth, ID band Patient awake    Reviewed: Allergy & Precautions, NPO status , Patient's Chart, lab work & pertinent test results  Airway Mallampati: II       Dental  (+) Teeth Intact   Pulmonary neg pulmonary ROS,    breath sounds clear to auscultation       Cardiovascular Exercise Tolerance: Good hypertension, Pt. on home beta blockers  Rhythm:Regular     Neuro/Psych    GI/Hepatic Neg liver ROS, hiatal hernia, GERD  Medicated,  Endo/Other  negative endocrine ROS  Renal/GU negative Renal ROS     Musculoskeletal   Abdominal Normal abdominal exam  (+)   Peds negative pediatric ROS (+)  Hematology negative hematology ROS (+)   Anesthesia Other Findings   Reproductive/Obstetrics                             Anesthesia Physical Anesthesia Plan  ASA: II  Anesthesia Plan: General   Post-op Pain Management:    Induction: Intravenous  Airway Management Planned: Natural Airway and Nasal Cannula  Additional Equipment:   Intra-op Plan:   Post-operative Plan:   Informed Consent: I have reviewed the patients History and Physical, chart, labs and discussed the procedure including the risks, benefits and alternatives for the proposed anesthesia with the patient or authorized representative who has indicated his/her understanding and acceptance.     Plan Discussed with: CRNA  Anesthesia Plan Comments:         Anesthesia Quick Evaluation

## 2016-10-14 NOTE — Transfer of Care (Signed)
Immediate Anesthesia Transfer of Care Note  Patient: Cindy Ball  Procedure(s) Performed: Procedure(s): COLONOSCOPY WITH PROPOFOL (N/A) ESOPHAGOGASTRODUODENOSCOPY (EGD) WITH PROPOFOL (N/A)  Patient Location: PACU  Anesthesia Type:General  Level of Consciousness: sedated  Airway & Oxygen Therapy: Patient Spontanous Breathing and Patient connected to nasal cannula oxygen  Post-op Assessment: Report given to RN and Post -op Vital signs reviewed and stable  Post vital signs: Reviewed and stable  Last Vitals:  Vitals:   10/14/16 1248 10/14/16 1358  BP: (!) 151/84   Pulse: (!) 110   Resp: 16   Temp: 36.4 C (!) 35.7 C    Last Pain:  Vitals:   10/14/16 1358  TempSrc: Tympanic      Patients Stated Pain Goal: 0 [phone] [phone])  Complications: No apparent anesthesia complications

## 2016-10-14 NOTE — Anesthesia Postprocedure Evaluation (Signed)
Anesthesia Post Note  Patient: Cindy Ball  Procedure(s) Performed: Procedure(s) (LRB): COLONOSCOPY WITH PROPOFOL (N/A) ESOPHAGOGASTRODUODENOSCOPY (EGD) WITH PROPOFOL (N/A)  Patient location during evaluation: PACU Anesthesia Type: General Level of consciousness: awake Pain management: pain level controlled Vital Signs Assessment: post-procedure vital signs reviewed and stable Respiratory status: spontaneous breathing Cardiovascular status: stable Anesthetic complications: no     Last Vitals:  Vitals:   10/14/16 1358 10/14/16 1400  BP:  (!) 101/58  Pulse:  73  Resp:  16  Temp: (!) 35.7 C     Last Pain:  Vitals:   10/14/16 1358  TempSrc: Tympanic                 VAN STAVEREN,Ellen Mayol

## 2016-10-14 NOTE — Op Note (Signed)
[facility] Gastroenterology Patient Name: Cindy Ball Procedure Date: 10/14/2016 1:28 PM MRN: [id number] Account #: [id number] Date of Birth: [birth year] Admit Type: Outpatient Age: 70 Room: [facility] ENDO ROOM 4 Gender: Female Note Status: Finalized Procedure:            Upper GI endoscopy Indications:          Heartburn, Gastro-esophageal reflux disease, Follow-up                        of gastro-esophageal reflux disease Providers:            Manya Silvas, MD Referring MD:         Juluis Rainier (Referring MD) Medicines:            Propofol per Anesthesia Complications:        No immediate complications. Procedure:            Pre-Anesthesia Assessment:                       - After reviewing the risks and benefits, the patient                        was deemed in satisfactory condition to undergo the                        procedure.                       After obtaining informed consent, the endoscope was                        passed under direct vision. Throughout the procedure,                        the patient's blood pressure, pulse, and oxygen                        saturations were monitored continuously. The Endoscope                        was introduced through the mouth, and advanced to the                        second part of duodenum. The upper GI endoscopy was                        accomplished without difficulty. The patient tolerated                        the procedure well. Findings:      The examined esophagus was normal. GEJ 36cm.      Diffuse and patchy moderate inflammation characterized by erythema and       granularity was found in the gastric body. Biopsies were taken with a       cold forceps for histology. Biopsies were taken with a cold forceps for       Helicobacter pylori testing. From proximal antrum.      The examined duodenum was normal. Impression:           - Normal esophagus.                       -  Gastritis.  Biopsied.                       - Normal examined duodenum. Recommendation:       - Await pathology results.                       - Perform a colonoscopy as previously scheduled. Manya Silvas, MD 10/14/2016 1:42:35 PM This report has been signed electronically. Number of Addenda: 0 Note Initiated On: 10/14/2016 1:28 PM      [facility]

## 2016-10-14 NOTE — H&P (Signed)
Primary Care Physician:  Sallee Lange, NP Primary Gastroenterologist:  Dr. Vira Agar  Pre-Procedure History & Physical: HPI:  Cindy Ball is a 70 y.o. female is here for an endoscopy and colonoscopy.   Past Medical History:  Diagnosis Date  . Cancer ([place]) 2011   L-Breast  . GERD (gastroesophageal reflux disease)   . Heart murmur   . Helicobacter pylori infection 02/2015  . History of hiatal hernia   . Hyperlipidemia   . Hypertension   . IBS (irritable bowel syndrome)   . Lump or mass in breast    L breast  . 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 Malignant neoplasm of upper-outer quadrant of female breast ([place])   . Osteoporosis   . Vitamin D deficiency disease     Past Surgical History:  Procedure Laterality Date  . 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  Marland Kitchen BREAST SURGERY Left 2011   Lumpectomy  . CHOLECYSTECTOMY    . COLONOSCOPY  2012, Keith Rake, MD   04/27/2008: Adenomatous polyp from the hepatic flexure and a tubular adenoma in the rectum.  Marland Kitchen DILATION AND CURETTAGE OF UTERUS  1970  . mammosite placement  2012  . mammosite removal  2012  . PORT-A-CATH REMOVAL  01/2012  . TUBAL LIGATION    . UPPER GI ENDOSCOPY  2005  . UPPER GI ENDOSCOPY  04/27/2008   Gaylyn Cheers, M.D.: Moderate active gastritis, H. pylori not identified.    Prior to Admission medications   Medication Sig Start Date End Date Taking? Authorizing Provider  amLODipine (NORVASC) 5 MG tablet TAKE 1 TABLET (5 MG TOTAL) BY MOUTH ONCE DAILY. 12/15/15  Yes Historical Provider, MD  metoprolol succinate (TOPROL-XL) 25 MG 24 hr tablet Take by mouth. 12/27/15  Yes Historical Provider, MD  omeprazole (PRILOSEC) 20 MG capsule TAKE 1 TABLET BY MOUTH TWICE DAILY AS DIRECTED. 01/04/16  Yes Historical Provider, MD    Allergies as of 10/04/2016 - Review Complete 08/14/2016   Allergen Reaction Noted  . Clarithromycin Palpitations 08/14/2016  . Clindamycin/lincomycin  08/21/2015    Family History  Problem Relation Age of Onset  . Breast cancer Maternal Grandmother 90  . Cancer Maternal Grandmother     breast cancer, age greater than 21  . Lung cancer Father     Social History   Social History  . Marital status: Married    Spouse name: N/A  . Number of children: N/A  . Years of education: N/A   Occupational History  . Not on file.   Social History Main Topics  . Smoking status: Never Smoker  . Smokeless tobacco: Never Used  . Alcohol use No  . Drug use: No  . Sexual activity: Not on file   Other Topics Concern  . Not on file   Social History Narrative  . No narrative on file    Review of Systems: See HPI, otherwise negative ROS  Physical Exam: BP (!) 151/84   Pulse (!) 110   Temp 97.6 F (36.4 C) (Tympanic)   Resp 16   Ht 5\' 2"  (1.575 m)   Wt 64 kg (141 lb)   SpO2 100%   BMI 25.79 kg/m  General:   Alert,  pleasant and cooperative in NAD Head:  Normocephalic and atraumatic. Neck:  Supple; no masses or thyromegaly. Lungs:  Clear throughout to auscultation.    Heart:  Regular  rate and rhythm. Abdomen:  Soft, nontender and nondistended. Normal bowel sounds, without guarding, and without rebound.   Neurologic:  Alert and  oriented x4;  grossly normal neurologically.  Impression/Plan: Cindy Ball is here for an endoscopy and colonoscopy to be performed for GERD and PH colon polyps  Risks, benefits, limitations, and alternatives regarding  endoscopy and colonoscopy have been reviewed with the patient.  Questions have been answered.  All parties agreeable.   Gaylyn Cheers, MD  10/14/2016, 1:29 PM

## 2016-10-14 NOTE — Op Note (Signed)
[facility] Gastroenterology Patient Name: Cindy Ball Procedure Date: 10/14/2016 1:28 PM MRN: [id number] Account #: [id number] Date of Birth: 1946-06-11 Admit Type: Outpatient Age: 70 Room: [facility] ENDO ROOM 4 Gender: Female Note Status: Finalized Procedure:            Colonoscopy Indications:          High risk colon cancer surveillance: Personal history                        of colonic polyps Providers:            Manya Silvas, MD Referring MD:         Juluis Rainier (Referring MD) Medicines:            Propofol per Anesthesia Complications:        No immediate complications. Procedure:            Pre-Anesthesia Assessment:                       - After reviewing the risks and benefits, the patient                        was deemed in satisfactory condition to undergo the                        procedure.                       After obtaining informed consent, the colonoscope was                        passed under direct vision. Throughout the procedure,                        the patient's blood pressure, pulse, and oxygen                        saturations were monitored continuously. The                        Colonoscope was introduced through the anus and                        advanced to the the cecum, identified by appendiceal                        orifice and ileocecal valve. The colonoscopy was                        performed without difficulty. The patient tolerated the                        procedure well. The quality of the bowel preparation                        was excellent. Findings:      Multiple small-mouthed diverticula were found in the sigmoid colon and       descending colon.      Internal hemorrhoids were found during endoscopy. The hemorrhoids were       small and Grade I (internal hemorrhoids that do not prolapse).  The exam was otherwise without abnormality. Impression:           - Diverticulosis in the sigmoid  colon and in the                        descending colon.                       - Internal hemorrhoids.                       - The examination was otherwise normal.                       - No specimens collected. Recommendation:       - Await pathology results. Manya Silvas, MD 10/14/2016 1:55:28 PM This report has been signed electronically. Number of Addenda: 0 Note Initiated On: 10/14/2016 1:28 PM Scope Withdrawal Time: 0 hours 5 minutes 19 seconds  Total Procedure Duration: 0 hours 9 minutes 25 seconds       [facility]

## 2016-10-15 ENCOUNTER — Encounter: Payer: Self-pay | Admitting: Unknown Physician Specialty

## 2016-10-15 LAB — SURGICAL PATHOLOGY

## 2017-02-12 ENCOUNTER — Other Ambulatory Visit: Payer: Medicare Other

## 2017-02-12 ENCOUNTER — Ambulatory Visit: Payer: Medicare Other

## 2017-02-13 ENCOUNTER — Inpatient Hospital Stay (HOSPITAL_BASED_OUTPATIENT_CLINIC_OR_DEPARTMENT_OTHER): Payer: Medicare Other | Admitting: Oncology

## 2017-02-13 ENCOUNTER — Encounter: Payer: Self-pay | Admitting: Oncology

## 2017-02-13 ENCOUNTER — Inpatient Hospital Stay: Payer: Medicare Other | Attending: Oncology | Admitting: *Deleted

## 2017-02-13 VITALS — BP 150/83 | HR 96 | Temp 97.5°F | Resp 18 | Wt 143.7 lb

## 2017-02-13 DIAGNOSIS — Z171 Estrogen receptor negative status [ER-]: Secondary | ICD-10-CM

## 2017-02-13 DIAGNOSIS — E559 Vitamin D deficiency, unspecified: Secondary | ICD-10-CM

## 2017-02-13 DIAGNOSIS — Z801 Family history of malignant neoplasm of trachea, bronchus and lung: Secondary | ICD-10-CM | POA: Insufficient documentation

## 2017-02-13 DIAGNOSIS — K219 Gastro-esophageal reflux disease without esophagitis: Secondary | ICD-10-CM | POA: Insufficient documentation

## 2017-02-13 DIAGNOSIS — Z9221 Personal history of antineoplastic chemotherapy: Secondary | ICD-10-CM | POA: Diagnosis not present

## 2017-02-13 DIAGNOSIS — Z803 Family history of malignant neoplasm of breast: Secondary | ICD-10-CM | POA: Diagnosis not present

## 2017-02-13 DIAGNOSIS — Z79899 Other long term (current) drug therapy: Secondary | ICD-10-CM

## 2017-02-13 DIAGNOSIS — Z853 Personal history of malignant neoplasm of breast: Secondary | ICD-10-CM | POA: Insufficient documentation

## 2017-02-13 DIAGNOSIS — M810 Age-related osteoporosis without current pathological fracture: Secondary | ICD-10-CM | POA: Insufficient documentation

## 2017-02-13 DIAGNOSIS — C50919 Malignant neoplasm of unspecified site of unspecified female breast: Secondary | ICD-10-CM

## 2017-02-13 DIAGNOSIS — E785 Hyperlipidemia, unspecified: Secondary | ICD-10-CM

## 2017-02-13 DIAGNOSIS — I10 Essential (primary) hypertension: Secondary | ICD-10-CM

## 2017-02-13 DIAGNOSIS — C50912 Malignant neoplasm of unspecified site of left female breast: Secondary | ICD-10-CM

## 2017-02-13 LAB — CBC WITH DIFFERENTIAL/PLATELET
BASOS ABS: 0 10*3/uL (ref 0–0.1)
Basophils Relative: 1 %
EOS PCT: 1 %
Eosinophils Absolute: 0 10*3/uL (ref 0–0.7)
HEMATOCRIT: 39.2 % (ref 35.0–47.0)
Hemoglobin: 13.4 g/dL (ref 12.0–16.0)
LYMPHS PCT: 40 %
Lymphs Abs: 2 10*3/uL (ref 1.0–3.6)
MCH: 30.2 pg (ref 26.0–34.0)
MCHC: 34.3 g/dL (ref 32.0–36.0)
MCV: 88.1 fL (ref 80.0–100.0)
MONO ABS: 0.6 10*3/uL (ref 0.2–0.9)
MONOS PCT: 12 %
NEUTROS ABS: 2.3 10*3/uL (ref 1.4–6.5)
Neutrophils Relative %: 46 %
PLATELETS: 235 10*3/uL (ref 150–440)
RBC: 4.46 MIL/uL (ref 3.80–5.20)
RDW: 15.2 % — AB (ref 11.5–14.5)
WBC: 4.9 10*3/uL (ref 3.6–11.0)

## 2017-02-13 LAB — COMPREHENSIVE METABOLIC PANEL WITH GFR
ALT: 22 U/L (ref 14–54)
AST: 27 U/L (ref 15–41)
Albumin: 4 g/dL (ref 3.5–5.0)
Alkaline Phosphatase: 97 U/L (ref 38–126)
Anion gap: 4 — ABNORMAL LOW (ref 5–15)
BUN: 14 mg/dL (ref 6–20)
CO2: 27 mmol/L (ref 22–32)
Calcium: 9.3 mg/dL (ref 8.9–10.3)
Chloride: 106 mmol/L (ref 101–111)
Creatinine, Ser: 0.85 mg/dL (ref 0.44–1.00)
GFR calc Af Amer: >60 mL/min
GFR calc non Af Amer: >60 mL/min
Glucose, Bld: 134 mg/dL — ABNORMAL HIGH (ref 65–99)
Potassium: 3.6 mmol/L (ref 3.5–5.1)
Sodium: 137 mmol/L (ref 135–145)
Total Bilirubin: 0.6 mg/dL (ref 0.3–1.2)
Total Protein: 7.5 g/dL (ref 6.5–8.1)

## 2017-02-13 NOTE — Progress Notes (Signed)
Here for follow up. Doing well she states

## 2017-02-13 NOTE — Progress Notes (Signed)
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 Patient completed 4 cycles of adjuvant chemotherapy with Cytoxan and Taxotere in March 2012. Patient continues to follow very closely with Dr. Bary Castilla  And has been getting yearly mammograms  Interval history- doing well. Denies any complaints. Denies any unintentional weight loss, loss of appetite or aches and pains anywhere  ECOG PS- 1 Pain scale- 0 Opioid associated constipation- n/a  Review of systems- Review of Systems  Constitutional: Negative for chills, fever, malaise/fatigue and weight loss.  HENT: Negative for congestion, ear discharge and nosebleeds.   Eyes: Negative for blurred vision.  Respiratory: Negative for cough, hemoptysis, sputum production, shortness of breath and wheezing.   Cardiovascular: Negative for chest pain, palpitations, orthopnea and claudication.  Gastrointestinal: Negative for abdominal pain, blood in stool, constipation, diarrhea, heartburn, melena, nausea and vomiting.  Genitourinary: Negative for dysuria, flank pain, frequency, hematuria and urgency.  Musculoskeletal: Negative for back pain, joint pain and myalgias.  Skin: Negative for rash.  Neurological: Negative for dizziness, tingling, focal weakness, seizures, weakness and headaches.  Endo/Heme/Allergies: Does not bruise/bleed easily.  Psychiatric/Behavioral:  Negative for depression and suicidal ideas. The patient does not have insomnia.      Current treatment- observation  Allergies  Allergen Reactions  . Clarithromycin Palpitations  . Clindamycin/Lincomycin     Pt states she can take     Past Medical History:  Diagnosis Date  . Cancer ([place]) 2011   L-Breast  . GERD (gastroesophageal reflux disease)   . Heart murmur   . Helicobacter pylori infection 02/2015  . History of hiatal hernia   . Hyperlipidemia   . Hypertension   . IBS (irritable bowel syndrome)   . Lump or mass in breast    L breast  . 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 Malignant neoplasm of upper-outer quadrant of female breast ([place])   . Osteoporosis   . Vitamin D deficiency disease      Past Surgical History:  Procedure Laterality Date  . 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  Marland Kitchen BREAST SURGERY Left 2011   Lumpectomy  . CHOLECYSTECTOMY    . COLONOSCOPY  2012, Keith Rake, MD   04/27/2008: Adenomatous polyp from the hepatic flexure and a tubular adenoma in the rectum.  . COLONOSCOPY WITH PROPOFOL N/A 10/14/2016   Procedure: COLONOSCOPY WITH PROPOFOL;  Surgeon: Manya Silvas, MD;  Location: [facility] ENDOSCOPY;  Service: Endoscopy;  Laterality: N/A;  . DILATION AND CURETTAGE OF UTERUS  1970  . ESOPHAGOGASTRODUODENOSCOPY (EGD) WITH PROPOFOL N/A 10/14/2016   Procedure: ESOPHAGOGASTRODUODENOSCOPY (EGD) WITH PROPOFOL;  Surgeon: Manya Silvas, MD;  Location: [facility] ENDOSCOPY;  Service: Endoscopy;  Laterality: N/A;  . mammosite placement  2012  . mammosite removal  2012  . PORT-A-CATH REMOVAL  01/2012  . TUBAL LIGATION    . UPPER GI ENDOSCOPY  2005  .  UPPER GI ENDOSCOPY  04/27/2008   Gaylyn Cheers, M.D.: Moderate active gastritis, H. pylori not identified.    Social History   Social History  . Marital status: Married    Spouse name:  N/A  . Number of children: N/A  . Years of education: N/A   Occupational History  . Not on file.   Social History Main Topics  . Smoking status: Never Smoker  . Smokeless tobacco: Never Used  . Alcohol use No  . Drug use: No  . Sexual activity: Not on file   Other Topics Concern  . Not on file   Social History Narrative  . No narrative on file    Family History  Problem Relation Age of Onset  . Breast cancer Maternal Grandmother 90  . Cancer Maternal Grandmother     breast cancer, age greater than 30  . Lung cancer Father      Current Outpatient Prescriptions:  .  amLODipine (NORVASC) 5 MG tablet, TAKE 1 TABLET (5 MG TOTAL) BY MOUTH ONCE DAILY., Disp: , Rfl: 1 .  metoprolol succinate (TOPROL-XL) 25 MG 24 hr tablet, Take by mouth., Disp: , Rfl:  .  omeprazole (PRILOSEC) 20 MG capsule, TAKE 1 TABLET BY MOUTH TWICE DAILY AS DIRECTED., Disp: , Rfl: 1  Physical exam:  Vitals:   02/13/17 1024  BP: (!) 150/83  Pulse: 96  Resp: 18  Temp: 97.5 F (36.4 C)  TempSrc: Tympanic  Weight: 143 lb 11.2 oz (65.2 kg)   Physical Exam  Constitutional: She is oriented to person, place, and time and well-developed, well-nourished, and in no distress.  HENT:  Head: Normocephalic and atraumatic.  Eyes: EOM are normal. Pupils are equal, round, and reactive to light.  Neck: Normal range of motion.  Cardiovascular: Normal rate, regular rhythm and normal heart sounds.   Pulmonary/Chest: Effort normal and breath sounds normal.  Abdominal: Soft. Bowel sounds are normal.  Neurological: She is alert and oriented to person, place, and time.  Skin: Skin is warm and dry.   right chest wall shows port scar Breast exam was performed in seated and lying down position. Patient is status post left lumpectomy with a well-healed surgical scar. No evidence of any palpable masses. No evidence of axillary adenopathy. No evidence of any palpable masses or lumps in the right breast. No evidence of right  axillary adenopathy   CMP Latest Ref Rng & Units 02/13/2017  Glucose 65 - 99 mg/dL 134(H)  BUN 6 - 20 mg/dL 14  Creatinine 0.44 - 1.00 mg/dL 0.85  Sodium 135 - 145 mmol/L 137  Potassium 3.5 - 5.1 mmol/L 3.6  Chloride 101 - 111 mmol/L 106  CO2 22 - 32 mmol/L 27  Calcium 8.9 - 10.3 mg/dL 9.3  Total Protein 6.5 - 8.1 g/dL 7.5  Total Bilirubin 0.3 - 1.2 mg/dL 0.6  Alkaline Phos 38 - 126 U/L 97  AST 15 - 41 U/L 27  ALT 14 - 54 U/L 22   CBC Latest Ref Rng & Units 02/13/2017  WBC 3.6 - 11.0 K/uL 4.9  Hemoglobin 12.0 - 16.0 g/dL 13.4  Hematocrit 35.0 - 47.0 % 39.2  Platelets 150 - 440 K/uL 235      Assessment and plan- Patient is a 71 y.o. female with a history of stage I triple negative right breast cancer in 2011 status post adjuvant chemotherapy completed in 2012 who remains on yearly surveillance.  Labs reviewed from today are within normal limits. She will continue to get  mammograms at Dr. Marlyn Corporal and see him all in the mammograms. I will see her yearly basis but will not get routine blood work at this time. No role for checking tumor markers and stage I breast cancer   Visit Diagnosis 1. Malignant neoplasm of left breast in female, estrogen receptor negative, unspecified site of breast [facility])      Dr. Randa Evens, MD, MPH [facility] [place] [facility] Pager- [phone] 02/13/2017 10:51 AM

## 2017-02-14 LAB — CANCER ANTIGEN 27.29: CA 27.29: 35.3 U/mL (ref 0.0–38.6)

## 2017-06-26 ENCOUNTER — Other Ambulatory Visit: Payer: Self-pay

## 2017-06-26 DIAGNOSIS — Z853 Personal history of malignant neoplasm of breast: Secondary | ICD-10-CM

## 2017-08-15 ENCOUNTER — Ambulatory Visit
Admission: RE | Admit: 2017-08-15 | Discharge: 2017-08-15 | Disposition: A | Discharge status: Home / Self Care | Payer: Medicare Other | Source: Ambulatory Visit | Attending: General Surgery | Admitting: General Surgery

## 2017-08-15 DIAGNOSIS — Z853 Personal history of malignant neoplasm of breast: Secondary | ICD-10-CM

## 2017-08-15 HISTORY — DX: Malignant neoplasm of unspecified site of unspecified female breast: C50.919

## 2017-08-21 ENCOUNTER — Ambulatory Visit (INDEPENDENT_AMBULATORY_CARE_PROVIDER_SITE_OTHER): Payer: Medicare Other | Admitting: General Surgery

## 2017-08-21 ENCOUNTER — Encounter: Payer: Self-pay | Admitting: General Surgery

## 2017-08-21 VITALS — BP 146/82 | HR 88 | Resp 12 | Ht 64.0 in | Wt 142.0 lb

## 2017-08-21 DIAGNOSIS — Z853 Personal history of malignant neoplasm of breast: Secondary | ICD-10-CM | POA: Diagnosis not present

## 2017-08-21 NOTE — Progress Notes (Addendum)
Patient ID: Cindy Ball, female   DOB: 09/07/1946, 71 y.o.   MRN: [id number]  Chief Complaint  Patient presents with  . Follow-up    HPI Cindy Ball is a 71 y.o. female who presents for a breast evaluation. The most recent mammogram was done on 08/15/2017.  Patient does perform regular self breast checks and gets regular mammograms done.    HPI  Past Medical History:  Diagnosis Date  . Breast cancer ([place]) 2011   left breast  . Cancer ([place]) 2011   L-Breast  . GERD (gastroesophageal reflux disease)   . Heart murmur   . Helicobacter pylori infection 02/2015  . History of hiatal hernia   . Hyperlipidemia   . Hypertension   . IBS (irritable bowel syndrome)   . Lump or mass in breast    L breast  . 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 Malignant neoplasm of upper-outer quadrant of female breast ([place])   . Osteoporosis   . Vitamin D deficiency disease     Past Surgical History:  Procedure Laterality Date  . 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  Marland Kitchen BREAST SURGERY Left 2011   Lumpectomy  . CHOLECYSTECTOMY    . COLONOSCOPY  2012, Keith Rake, MD   04/27/2008: Adenomatous polyp from the hepatic flexure and a tubular adenoma in the rectum.  . COLONOSCOPY WITH PROPOFOL N/A 10/14/2016   Procedure: COLONOSCOPY WITH PROPOFOL;  Surgeon: Manya Silvas, MD;  Location: [facility] ENDOSCOPY;  Service: Endoscopy;  Laterality: N/A;  . DILATION AND CURETTAGE OF UTERUS  1970  . ESOPHAGOGASTRODUODENOSCOPY (EGD) WITH PROPOFOL N/A 10/14/2016   Procedure: ESOPHAGOGASTRODUODENOSCOPY (EGD) WITH PROPOFOL;  Surgeon: Manya Silvas, MD;  Location: [facility] ENDOSCOPY;  Service: Endoscopy;  Laterality: N/A;  . mammosite placement  2012  . mammosite removal  2012  . PORT-A-CATH REMOVAL  01/2012  . TUBAL LIGATION    . UPPER GI ENDOSCOPY  2005  . UPPER GI ENDOSCOPY  04/27/2008    Gaylyn Cheers, M.D.: Moderate active gastritis, H. pylori not identified.    Family History  Problem Relation Age of Onset  . Breast cancer Maternal Grandmother 90  . Cancer Maternal Grandmother        breast cancer, age greater than 65  . Lung cancer Father     Social History Social History  Substance Use Topics  . Smoking status: Never Smoker  . Smokeless tobacco: Never Used  . Alcohol use No    Allergies  Allergen Reactions  . Clarithromycin Palpitations  . Clindamycin/Lincomycin     Pt states she can take    Current Outpatient Prescriptions  Medication Sig Dispense Refill  . amLODipine (NORVASC) 5 MG tablet TAKE 1 TABLET (5 MG TOTAL) BY MOUTH ONCE DAILY.  1  . metoprolol succinate (TOPROL-XL) 25 MG 24 hr tablet Take by mouth.    Marland Kitchen omeprazole (PRILOSEC) 20 MG capsule TAKE 1 TABLET BY MOUTH TWICE DAILY AS DIRECTED.  1   No current facility-administered medications for this visit.     Review of Systems Review of Systems  Constitutional: Negative.   Respiratory: Negative.   Cardiovascular: Negative.     Blood pressure (!) 146/82, pulse 88, resp. rate 12, height 5\' 4"  (1.626 m), weight 142 lb (64.4 kg).  Physical Exam Physical Exam  Constitutional: She is oriented to person, place, and time. She appears well-developed and well-nourished.  Eyes: Conjunctivae are normal. No scleral icterus.  Neck: Neck supple.  Cardiovascular: Normal rate, regular rhythm and normal heart sounds.   PAC every 3-4 beats. Patient followed by Dr. Ubaldo Glassing.  Pulmonary/Chest: Effort normal and breath sounds normal. Right breast exhibits no inverted nipple, no mass, no nipple discharge, no skin change and no tenderness. Left breast exhibits no inverted nipple, no mass, no nipple discharge, no skin change and no tenderness.    Thickening left breast.   Lymphadenopathy:    She has no cervical adenopathy.    She has no axillary adenopathy.  Neurological: She is alert and oriented to  person, place, and time.  Skin: Skin is warm and dry.    Data Reviewed Bilateral diagnostic mammograms dated 08/15/2017 were reviewed. Postsurgical changes in the upper-outer quadrant of the left breast. BIRAD-2. Screening mammograms suggested for the future.  Very minimal scarring on mammograms when compared to the clinical thickening noted.  April 2018 medical oncology notes with Dr. Janese Banks reviewed. Assessment    Benign breast exam.    Plan    The patient questioned whether she needed to follow-up with medical oncology next year. On review of the notes, plans for further laboratory testing or be based on clinical findings alone. The patient will need to decide whether she finds these follow-ups to be of benefit. She was encouraged to follow up with at least one provider for annual breast exams and secondary review of her mammograms.    The patient has been asked to return to the office in one year with a bilateral screening mammogram. The patient is aware to call back for any questions or concerns.  HPI, Physical Exam, Assessment and Plan have been scribed under the direction and in the presence of Hervey Ard, MD.  Gaspar Cola, CMA  I have completed the exam and reviewed the above documentation for accuracy and completeness.  I agree with the above.  Haematologist has been used and any errors in dictation or transcription are unintentional.  Hervey Ard, M.D., F.A.C.S.  Robert Bellow 08/21/2017, 7:46 PM

## 2017-08-21 NOTE — Patient Instructions (Addendum)
The patient has been asked to return to the office in one year with a bilateral screening mammogram. 

## 2018-02-13 ENCOUNTER — Ambulatory Visit: Payer: Medicare Other | Admitting: Oncology

## 2018-02-20 ENCOUNTER — Inpatient Hospital Stay: Payer: Medicare Other | Attending: Oncology | Admitting: Oncology

## 2018-02-20 ENCOUNTER — Encounter: Payer: Self-pay | Admitting: Oncology

## 2018-02-20 VITALS — BP 137/85 | HR 98 | Temp 97.0°F | Resp 18 | Ht 64.0 in | Wt 139.4 lb

## 2018-02-20 DIAGNOSIS — Z853 Personal history of malignant neoplasm of breast: Secondary | ICD-10-CM | POA: Insufficient documentation

## 2018-02-20 DIAGNOSIS — Z9221 Personal history of antineoplastic chemotherapy: Secondary | ICD-10-CM | POA: Insufficient documentation

## 2018-02-20 DIAGNOSIS — Z08 Encounter for follow-up examination after completed treatment for malignant neoplasm: Secondary | ICD-10-CM

## 2018-02-20 NOTE — Progress Notes (Signed)
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 Patient completed 4 cycles of adjuvant chemotherapy with Cytoxan and Taxotere in March 2012. Patient continues to follow very closely with Dr. Bary Castilla  And has been getting yearly mammograms     Interval history- she recently developed symptoms of heart palpitations for which she received holter  Monitoring and will f/u with cardiology. Otherwise reports doing well. Denies any fatigue, unintentional weight loss. Denies any aches/pains anywhere  ECOG PS- 0 Pain scale- 0   Review of systems- Review of Systems  Constitutional: Negative for chills, fever, malaise/fatigue and weight loss.  HENT: Negative for congestion, ear discharge and nosebleeds.   Eyes: Negative for blurred vision.  Respiratory: Negative for cough, hemoptysis, sputum production, shortness of breath and wheezing.   Cardiovascular: Negative for chest pain, palpitations, orthopnea and claudication.  Gastrointestinal: Negative for abdominal pain, blood in stool, constipation, diarrhea, heartburn, melena, nausea and vomiting.  Genitourinary: Negative for dysuria, flank pain, frequency, hematuria and urgency.  Musculoskeletal: Negative for back pain, joint pain and myalgias.  Skin: Negative for rash.  Neurological: Negative for dizziness, tingling, focal weakness, seizures, weakness and  headaches.  Endo/Heme/Allergies: Does not bruise/bleed easily.  Psychiatric/Behavioral: Negative for depression and suicidal ideas. The patient does not have insomnia.       Allergies  Allergen Reactions  . Clarithromycin Palpitations  . Clindamycin/Lincomycin     Pt states she can take     Past Medical History:  Diagnosis Date  . Breast cancer ([place]) 2011   left breast  . Cancer ([place]) 2011   L-Breast  . GERD (gastroesophageal reflux disease)   . Heart murmur   . Helicobacter pylori infection 02/2015  . History of hiatal hernia   . Hyperlipidemia   . Hypertension   . IBS (irritable bowel syndrome)   . Lump or mass in breast    L breast  . 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 Malignant neoplasm of upper-outer quadrant of female breast ([place])   . Osteoporosis   . Vitamin D deficiency disease      Past Surgical History:  Procedure Laterality Date  . 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  Marland Kitchen BREAST SURGERY Left 2011   Lumpectomy  . CHOLECYSTECTOMY    . COLONOSCOPY  2012, Keith Rake, MD   04/27/2008: Adenomatous polyp from the hepatic flexure and a tubular adenoma in the rectum.  . COLONOSCOPY WITH PROPOFOL N/A 10/14/2016   Procedure: COLONOSCOPY WITH PROPOFOL;  Surgeon: Manya Silvas, MD;  Location: [facility] ENDOSCOPY;  Service: Endoscopy;  Laterality: N/A;  . DILATION AND CURETTAGE OF UTERUS  1970  . ESOPHAGOGASTRODUODENOSCOPY (EGD) WITH PROPOFOL N/A 10/14/2016   Procedure: ESOPHAGOGASTRODUODENOSCOPY (EGD) WITH PROPOFOL;  Surgeon: Manya Silvas, MD;  Location: [facility] ENDOSCOPY;  Service: Endoscopy;  Laterality: N/A;  . mammosite placement  2012  .  mammosite removal  2012  . PORT-A-CATH REMOVAL  01/2012  . TUBAL LIGATION    . UPPER GI ENDOSCOPY  2005  . UPPER GI ENDOSCOPY  04/27/2008   Gaylyn Cheers, M.D.: Moderate active gastritis, H. pylori  not identified.    Social History   Socioeconomic History  . Marital status: Married    Spouse name: Not on file  . Number of children: Not on file  . Years of education: Not on file  . Highest education level: Not on file  Occupational History  . Not on file  Social Needs  . Financial resource strain: Not on file  . Food insecurity:    Worry: Not on file    Inability: Not on file  . Transportation needs:    Medical: Not on file    Non-medical: Not on file  Tobacco Use  . Smoking status: Never Smoker  . Smokeless tobacco: Never Used  Substance and Sexual Activity  . Alcohol use: No  . Drug use: No  . Sexual activity: Not on file  Lifestyle  . Physical activity:    Days per week: Not on file    Minutes per session: Not on file  . Stress: Not on file  Relationships  . Social connections:    Talks on phone: Not on file    Gets together: Not on file    Attends religious service: Not on file    Active member of club or organization: Not on file    Attends meetings of clubs or organizations: Not on file    Relationship status: Not on file  . Intimate partner violence:    Fear of current or ex partner: Not on file    Emotionally abused: Not on file    Physically abused: Not on file    Forced sexual activity: Not on file  Other Topics Concern  . Not on file  Social History Narrative  . Not on file    Family History  Problem Relation Age of Onset  . Breast cancer Maternal Grandmother 90  . Cancer Maternal Grandmother        breast cancer, age greater than 45  . Lung cancer Father      Current Outpatient Medications:  .  amLODipine (NORVASC) 5 MG tablet, TAKE 1 TABLET (5 MG TOTAL) BY MOUTH ONCE DAILY., Disp: , Rfl: 1 .  metoprolol succinate (TOPROL-XL) 25 MG 24 hr tablet, Take by mouth., Disp: , Rfl:  .  omeprazole (PRILOSEC) 20 MG capsule, TAKE 1 TABLET BY MOUTH TWICE DAILY AS DIRECTED., Disp: , Rfl: 1  Physical exam:  Vitals:   02/20/18 1134  BP: 137/85    Pulse: 98  Resp: 18  Temp: (!) 97 F (36.1 C)  TempSrc: Tympanic  SpO2: 99%  Weight: 139 lb 6.4 oz (63.2 kg)  Height: 5\' 4"  (1.626 m)   Physical Exam  Constitutional: She is oriented to person, place, and time. She appears well-developed and well-nourished.  HENT:  Head: Normocephalic and atraumatic.  Eyes: Pupils are equal, round, and reactive to light. EOM are normal.  Neck: Normal range of motion.  Cardiovascular: Normal rate, regular rhythm and normal heart sounds.  Pulmonary/Chest: Effort normal and breath sounds normal.  Abdominal: Soft. Bowel sounds are normal.  Neurological: She is alert and oriented to person, place, and time.  Skin: Skin is warm and dry.   Breast exam was performed in seated and lying down position. Patient is status post right lumpectomy with a well-healed  surgical scar. No evidence of any palpable masses. No evidence of axillary adenopathy. No evidence of any palpable masses or lumps in the left breast. No evidence of leftt axillary adenopathy   CMP Latest Ref Rng & Units 02/13/2017  Glucose 65 - 99 mg/dL 134(H)  BUN 6 - 20 mg/dL 14  Creatinine 0.44 - 1.00 mg/dL 0.85  Sodium 135 - 145 mmol/L 137  Potassium 3.5 - 5.1 mmol/L 3.6  Chloride 101 - 111 mmol/L 106  CO2 22 - 32 mmol/L 27  Calcium 8.9 - 10.3 mg/dL 9.3  Total Protein 6.5 - 8.1 g/dL 7.5  Total Bilirubin 0.3 - 1.2 mg/dL 0.6  Alkaline Phos 38 - 126 U/L 97  AST 15 - 41 U/L 27  ALT 14 - 54 U/L 22   CBC Latest Ref Rng & Units 02/13/2017  WBC 3.6 - 11.0 K/uL 4.9  Hemoglobin 12.0 - 16.0 g/dL 13.4  Hematocrit 35.0 - 47.0 % 39.2  Platelets 150 - 440 K/uL 235      Assessment and plan- Patient is a 72 y.o. female female with a history of stage I triple negative right breast cancer in 2011 status post adjuvant chemotherapy completed in 2012 here for breast cancer surveillance   Clinically patient is doing well and there is no evidence of recurrence on today's exam.  Recent mammogram from  October 2018 did not reveal any evidence of malignancy.  She will continue to follow-up with Dr. Bary Castilla and get yearly mammograms through him.  Since it has been 5 years since her diagnosis and has yearly breast exam scheduled with Dr. Tollie Pizza she does not require oncology follow-up at this time.  If she develops any concerning signs and symptoms including all but not limited to new bone pains, fatigue unintentional weight loss she can be referred to [place] in the future    Visit Diagnosis 1. Encounter for follow-up surveillance of breast cancer      Dr. Randa Evens, MD, MPH [facility] at [facility] [phone] 02/20/2018 1:27 PM

## 2018-02-20 NOTE — Progress Notes (Signed)
No new changes noted today 

## 2018-07-06 ENCOUNTER — Other Ambulatory Visit: Payer: Self-pay

## 2018-07-06 DIAGNOSIS — Z1231 Encounter for screening mammogram for malignant neoplasm of breast: Secondary | ICD-10-CM

## 2018-08-17 ENCOUNTER — Ambulatory Visit
Admission: RE | Admit: 2018-08-17 | Discharge: 2018-08-17 | Disposition: A | Discharge status: Home / Self Care | Payer: Medicare Other | Source: Ambulatory Visit | Attending: General Surgery | Admitting: General Surgery

## 2018-08-17 DIAGNOSIS — Z1231 Encounter for screening mammogram for malignant neoplasm of breast: Secondary | ICD-10-CM | POA: Diagnosis present

## 2018-08-17 HISTORY — DX: Personal history of antineoplastic chemotherapy: Z92.21

## 2018-08-17 HISTORY — DX: Personal history of irradiation: Z92.3

## 2018-08-27 ENCOUNTER — Other Ambulatory Visit: Payer: Self-pay

## 2018-08-27 ENCOUNTER — Ambulatory Visit: Payer: Medicare Other | Admitting: General Surgery

## 2018-08-27 ENCOUNTER — Encounter: Payer: Self-pay | Admitting: General Surgery

## 2018-08-27 VITALS — BP 139/75 | HR 74 | Temp 97.9°F | Resp 16 | Ht 62.0 in | Wt 137.0 lb

## 2018-08-27 DIAGNOSIS — C50011 Malignant neoplasm of nipple and areola, right female breast: Secondary | ICD-10-CM | POA: Diagnosis not present

## 2018-08-27 NOTE — Patient Instructions (Addendum)
Patient will be asked to return to the office in one year with a bilateral screening mammogram. The patient is aware to call back for any questions or concerns. 

## 2018-08-27 NOTE — Progress Notes (Signed)
Patient ID: Cindy Ball, female   DOB: [birth year], 72 y.o.   MRN: [id number]  Chief Complaint  Patient presents with  . Follow-up    HPI Cindy Ball is a 72 y.o. female who presents for a breast evaluation. The most recent mammogram was done on 08/17/2018.  Patient does perform regular self breast checks and gets regular mammograms done.     HPI  Past Medical History:  Diagnosis Date  . Breast cancer ([place]) 2011   left breast  . Cancer ([place]) 2011   L-Breast  . GERD (gastroesophageal reflux disease)   . Heart murmur   . Helicobacter pylori infection 02/2015  . History of hiatal hernia   . Hyperlipidemia   . Hypertension   . IBS (irritable bowel syndrome)   . Lump or mass in breast    L breast  . 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 Malignant neoplasm of upper-outer quadrant of female breast ([place])   . Osteoporosis   . Personal history of chemotherapy 2011   left breast ca  . Personal history of radiation therapy 2011   left breast ca  . Vitamin D deficiency disease     Past Surgical History:  Procedure Laterality Date  . 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  Marland Kitchen BREAST EXCISIONAL BIOPSY Bilateral [id number]   benign  . BREAST LUMPECTOMY Left 10/04/2010   IDC  . BREAST SURGERY Left 2011   Lumpectomy  . CHOLECYSTECTOMY    . COLONOSCOPY  2012, Keith Rake, MD   04/27/2008: Adenomatous polyp from the hepatic flexure and a tubular adenoma in the rectum.  . COLONOSCOPY WITH PROPOFOL N/A 10/14/2016   Procedure: COLONOSCOPY WITH PROPOFOL;  Surgeon: Manya Silvas, MD;  Location: [facility] ENDOSCOPY;  Service: Endoscopy;  Laterality: N/A;  . DILATION AND CURETTAGE OF UTERUS  1970  . ESOPHAGOGASTRODUODENOSCOPY (EGD) WITH PROPOFOL N/A 10/14/2016   Procedure: ESOPHAGOGASTRODUODENOSCOPY (EGD) WITH PROPOFOL;  Surgeon: Manya Silvas, MD;  Location[facility] ENDOSCOPY;   Service: Endoscopy;  Laterality: N/A;  . mammosite placement  2012  . mammosite removal  2012  . PORT-A-CATH REMOVAL  01/2012  . TUBAL LIGATION    . UPPER GI ENDOSCOPY  2005  . UPPER GI ENDOSCOPY  04/27/2008   Gaylyn Cheers, M.D.: Moderate active gastritis, H. pylori not identified.    Family History  Problem Relation Age of Onset  . Breast cancer Maternal Grandmother 90  . Cancer Maternal Grandmother        breast cancer, age greater than 7  . Lung cancer Father     Social History Social History   Tobacco Use  . Smoking status: Never Smoker  . Smokeless tobacco: Never Used  Substance Use Topics  . Alcohol use: No  . Drug use: No    Allergies  Allergen Reactions  . Clarithromycin Palpitations  . Clindamycin/Lincomycin     Pt states she can take    Current Outpatient Medications  Medication Sig Dispense Refill  . amLODipine (NORVASC) 5 MG tablet TAKE 1 TABLET (5 MG TOTAL) BY MOUTH ONCE DAILY.  1  . metoprolol succinate (TOPROL-XL) 25 MG 24 hr tablet Take by mouth.    Marland Kitchen omeprazole (PRILOSEC) 20 MG capsule TAKE 1 TABLET BY MOUTH TWICE DAILY AS DIRECTED.  1   No current facility-administered medications for this visit.     Review of Systems Review of Systems  Constitutional: Negative.  Respiratory: Negative.   Cardiovascular: Negative.     Blood pressure 139/75, pulse 74, temperature 97.9 F (36.6 C), temperature source Skin, resp. rate 16, height 5\' 2"  (1.575 m), weight 137 lb (62.1 kg), SpO2 98 %.  Physical Exam Physical Exam  Constitutional: She is oriented to person, place, and time. She appears well-developed and well-nourished.  Eyes: Conjunctivae are normal. No scleral icterus.  Neck: Neck supple.  Cardiovascular: Normal rate, regular rhythm and normal heart sounds.  PAC every 3-4 beats. Patient followed by Dr. Ubaldo Glassing  Pulmonary/Chest: Effort normal and breath sounds normal. Right breast exhibits no inverted nipple, no mass, no nipple discharge, no  skin change and no tenderness. Left breast exhibits no inverted nipple, no mass, no nipple discharge, no skin change and no tenderness.    Lymphadenopathy:    She has no cervical adenopathy.    She has no axillary adenopathy.  Neurological: She is alert and oriented to person, place, and time.  Skin: Skin is warm and dry.    Data Reviewed Medical oncology notes of February 20, 2018 reviewed.  Bilateral mammograms dated August 17, 2018 reviewed.  BI-RADS-1.  Postsurgical changes with macrocalcification.  Assessment    No evidence of recurrent cancer.    Plan  The patient has been released from follow-up with medical oncology.  Patient will be asked to return to the office in one year with a bilateral screening mammogram.The patient is aware to call back for any questions or concerns.    HPI, Physical Exam, Assessment and Plan have been scribed under the direction and in the presence of Hervey Ard, MD.  Gaspar Cola, CMA  I have completed the exam and reviewed the above documentation for accuracy and completeness.  I agree with the above.  Haematologist has been used and any errors in dictation or transcription are unintentional.  Hervey Ard, M.D., F.A.C.S.  Cindy Ball 08/27/2018, 7:57 PM

## 2018-10-23 IMAGING — MG MM DIGITAL DIAGNOSTIC BILAT W/ TOMO W/ CAD
8 of 13 series · 8 of 29 positions shown · non-contrast
Comparison: Previous exam(s).

ACR Breast Density Category a: The breast tissue is almost entirely
fatty.

CLINICAL DATA: 71-year-old patient with history of left breast
cancer, status post lumpectomy in 1822. Annual exam. She is
asymptomatic.

EXAM:
2D DIGITAL DIAGNOSTIC BILATERAL MAMMOGRAM WITH CAD AND ADJUNCT TOMO

[L CC (1 of 2)]
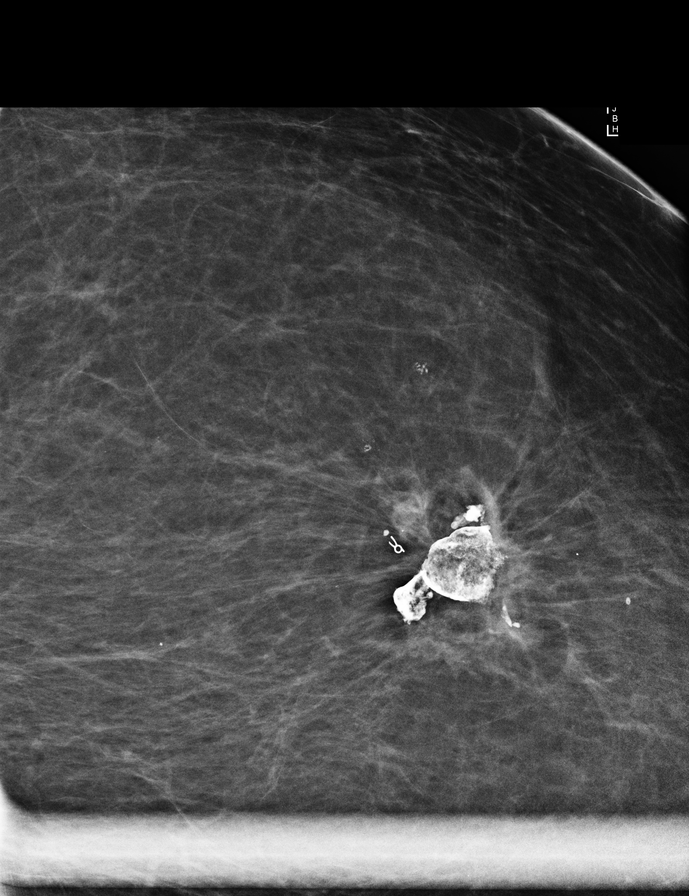

[L MLO synth-2D]
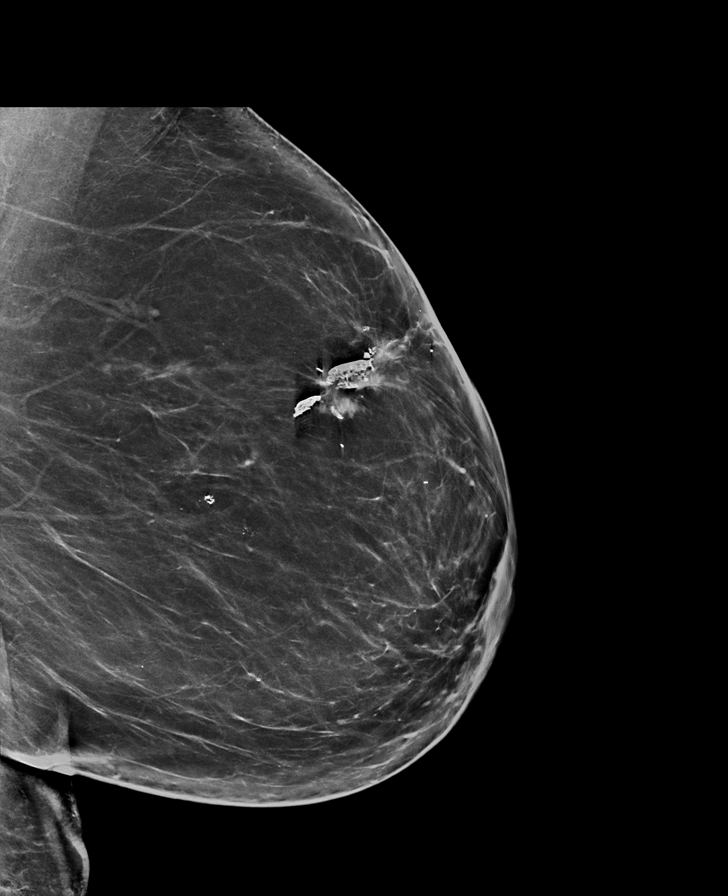

[R CC synth-2D]
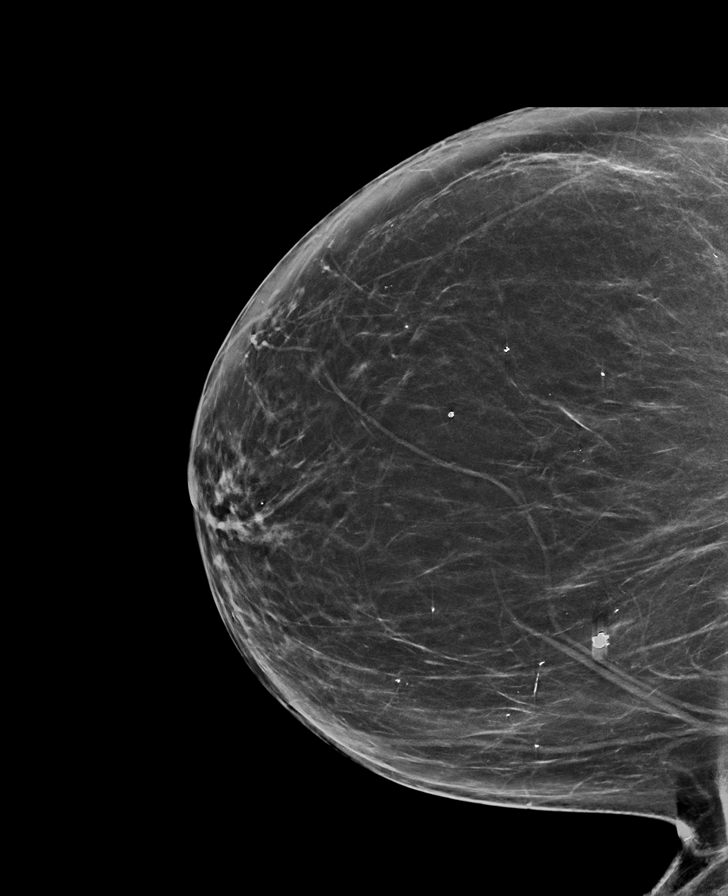

[L MLO]
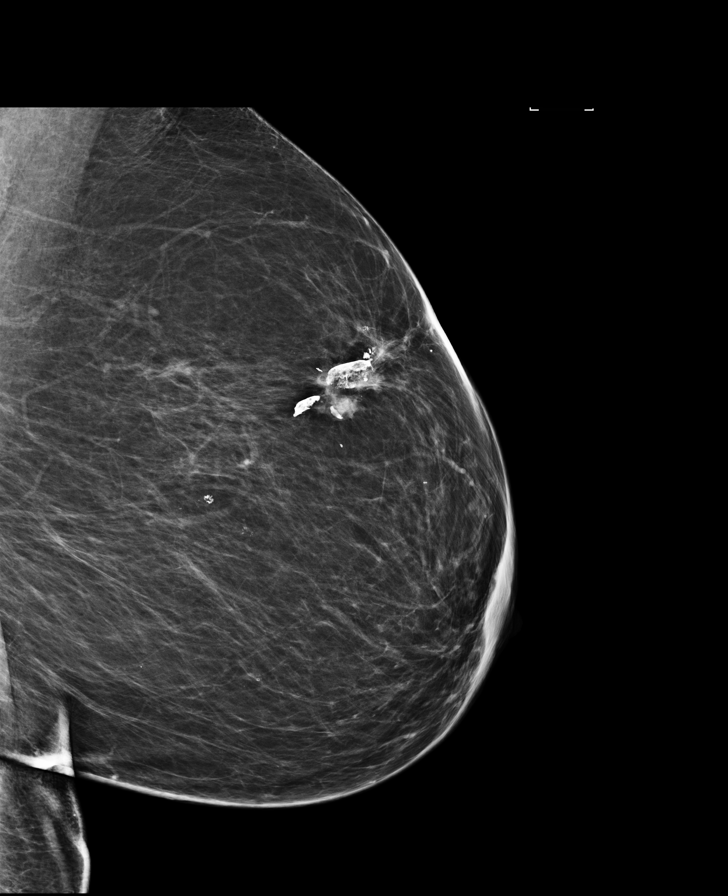

[L CC (2 of 2)]
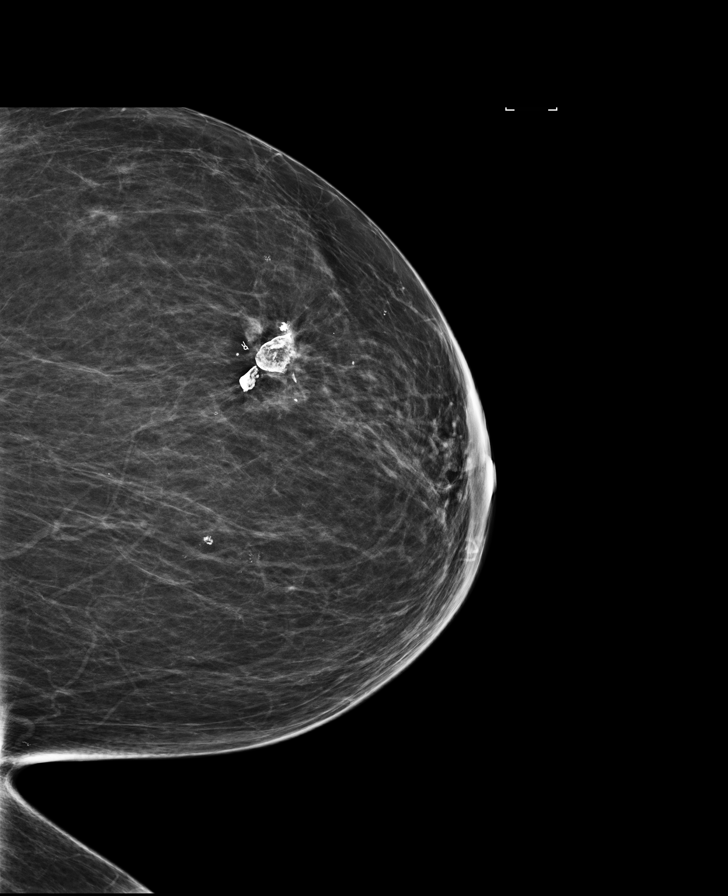

[L CC synth-2D]
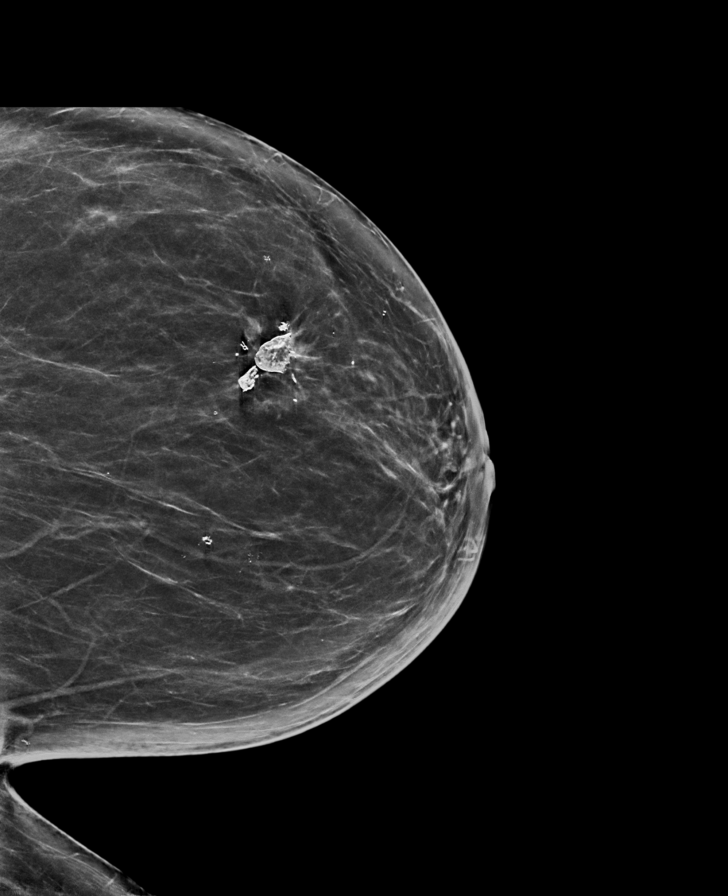

[R MLO synth-2D]
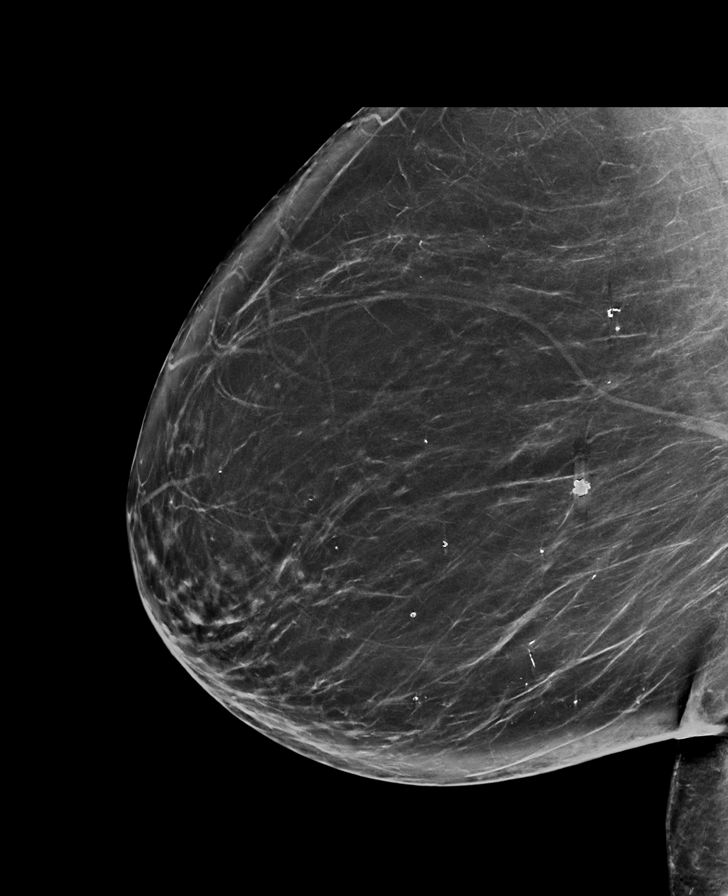

[R MLO]
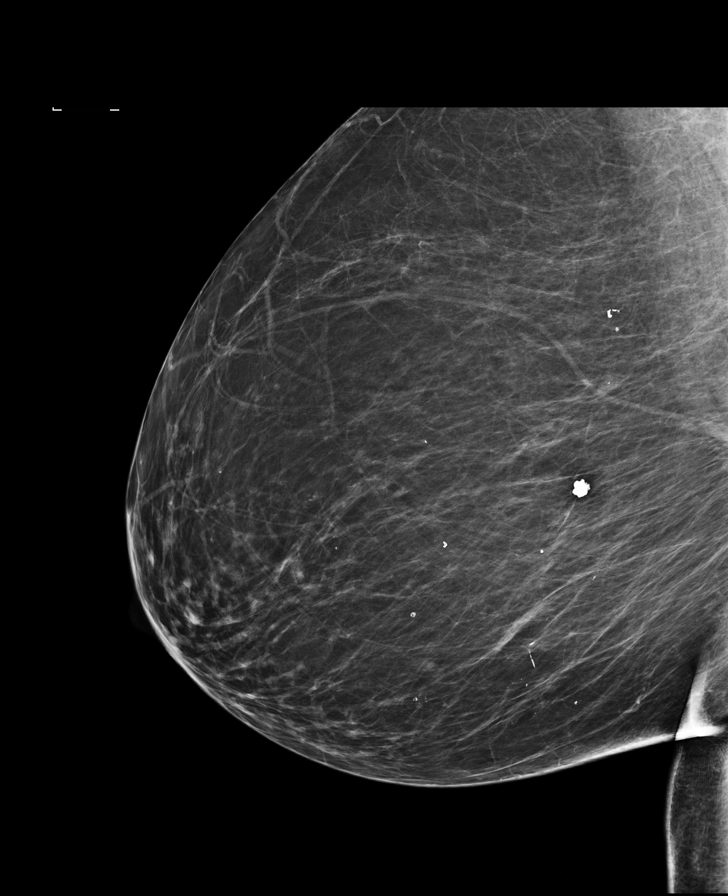

[8 of 29 positions shown; findings below may reference images not displayed]

FINDINGS: There are stable lumpectomy changes in the upper-outer quadrant of
the left breast, middle third. There are stable benign
calcifications bilaterally. No mass, suspicious microcalcification,
or nonsurgical distortion is identified in either breast to suggest
malignancy.

Mammographic images were processed with CAD.
IMPRESSION: No evidence of malignancy in either breast. Lumpectomy changes on
the left. The patient can return to the screening population.

RECOMMENDATION:
Screening mammogram in one year.(Code:1Z-8-V5D)

I have discussed the findings and recommendations with the patient.
Results were also provided in writing at the conclusion of the
visit. If applicable, a reminder letter will be sent to the patient
regarding the next appointment.

BI-RADS CATEGORY  2: Benign.

## 2019-05-18 ENCOUNTER — Encounter: Payer: Self-pay | Admitting: General Surgery

## 2019-06-23 ENCOUNTER — Other Ambulatory Visit: Payer: Self-pay

## 2019-06-23 DIAGNOSIS — Z1231 Encounter for screening mammogram for malignant neoplasm of breast: Secondary | ICD-10-CM

## 2019-08-19 ENCOUNTER — Ambulatory Visit
Admission: RE | Admit: 2019-08-19 | Discharge: 2019-08-19 | Disposition: A | Discharge status: Home / Self Care | Payer: Medicare Other | Source: Ambulatory Visit | Attending: Surgery | Admitting: Surgery

## 2019-08-19 DIAGNOSIS — Z1231 Encounter for screening mammogram for malignant neoplasm of breast: Secondary | ICD-10-CM | POA: Diagnosis not present

## 2019-08-27 ENCOUNTER — Ambulatory Visit: Payer: Medicare Other | Admitting: Surgery

## 2019-08-27 ENCOUNTER — Encounter: Payer: Self-pay | Admitting: Surgery

## 2019-08-27 ENCOUNTER — Other Ambulatory Visit: Payer: Self-pay

## 2019-08-27 VITALS — BP 167/104 | HR 109 | Temp 97.2°F | Resp 14 | Ht 62.0 in | Wt 136.2 lb

## 2019-08-27 DIAGNOSIS — C50011 Malignant neoplasm of nipple and areola, right female breast: Secondary | ICD-10-CM

## 2019-08-27 NOTE — Patient Instructions (Signed)
Patient rescheduled to 09/01/2019 due to emergency case at the hospital clinic was cancelled.

## 2019-09-01 ENCOUNTER — Ambulatory Visit (INDEPENDENT_AMBULATORY_CARE_PROVIDER_SITE_OTHER): Payer: Medicare Other | Admitting: Surgery

## 2019-09-01 ENCOUNTER — Other Ambulatory Visit: Payer: Self-pay

## 2019-09-01 ENCOUNTER — Encounter: Payer: Self-pay | Admitting: Surgery

## 2019-09-01 VITALS — BP 149/94 | HR 114 | Temp 98.1°F | Resp 14 | Ht 62.0 in | Wt 134.8 lb

## 2019-09-01 DIAGNOSIS — Z171 Estrogen receptor negative status [ER-]: Secondary | ICD-10-CM | POA: Diagnosis not present

## 2019-09-01 DIAGNOSIS — C50912 Malignant neoplasm of unspecified site of left female breast: Secondary | ICD-10-CM

## 2019-09-01 NOTE — Progress Notes (Signed)
09/01/2019  History of Present Illness: Cindy Ball is a 73 y.o. female with a history of left breast cancer, s/p left breast lumpectomy and SLNBx in 09/2010 with Dr. Bary Castilla.  She's s/p chemo and radiation.  Mammograms over the last several years have been negative.  Last mammogram on 08/19/19 only shows left lumpectomy changes without any suspicious calcifications or findings.  She denies any issues, masses, drainage, or other concerns.  Past Medical History: Past Medical History:  Diagnosis Date  . Breast cancer ([place]) 2011   left breast  . Cancer ([place]) 2011   L-Breast  . GERD (gastroesophageal reflux disease)   . Heart murmur   . Helicobacter pylori infection 02/2015  . History of hiatal hernia   . Hyperlipidemia   . Hypertension   . IBS (irritable bowel syndrome)   . Lump or mass in breast    L breast  . 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 Malignant neoplasm of upper-outer quadrant of female breast ([place])   . Osteoporosis   . Personal history of chemotherapy 2011   left breast ca  . Personal history of radiation therapy 2011   left breast ca  . Vitamin D deficiency disease      Past Surgical History: Past Surgical History:  Procedure Laterality Date  . 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  Marland Kitchen BREAST EXCISIONAL BIOPSY Bilateral [id number]   benign  . BREAST LUMPECTOMY Left 10/04/2010   [facility]  . BREAST SURGERY Left 2011   Lumpectomy  . CHOLECYSTECTOMY    . COLONOSCOPY  2012, Keith Rake, MD   04/27/2008: Adenomatous polyp from the hepatic flexure and a tubular adenoma in the rectum.  . COLONOSCOPY WITH PROPOFOL N/A 10/14/2016   Procedure: COLONOSCOPY WITH PROPOFOL;  Surgeon: Manya Silvas, MD;  Location: [facility] ENDOSCOPY;  Service: Endoscopy;  Laterality: N/A;  . DILATION AND CURETTAGE OF UTERUS  1970  . ESOPHAGOGASTRODUODENOSCOPY (EGD) WITH PROPOFOL N/A  10/14/2016   Procedure: ESOPHAGOGASTRODUODENOSCOPY (EGD) WITH PROPOFOL;  Surgeon: Manya Silvas, MD;  Location: [facility] ENDOSCOPY;  Service: Endoscopy;  Laterality: N/A;  . mammosite placement  2012  . mammosite removal  2012  . PORT-A-CATH REMOVAL  01/2012  . TUBAL LIGATION    . UPPER GI ENDOSCOPY  2005  . UPPER GI ENDOSCOPY  04/27/2008   Gaylyn Cheers, M.D.: Moderate active gastritis, H. pylori not identified.    Home Medications: Prior to Admission medications   Medication Sig Start Date End Date Taking? Authorizing Provider  amLODipine (NORVASC) 5 MG tablet TAKE 1 TABLET (5 MG TOTAL) BY MOUTH ONCE DAILY. 12/15/15  Yes [provider]  metoprolol succinate (TOPROL-XL) 25 MG 24 hr tablet Take by mouth. 12/27/15  Yes [provider]  omeprazole (PRILOSEC) 20 MG capsule TAKE 1 TABLET BY MOUTH TWICE DAILY AS DIRECTED. 01/04/16  Yes [provider]    Allergies: Allergies  Allergen Reactions  . Clarithromycin Palpitations  . Clindamycin/Lincomycin     Pt states she can take    Review of Systems: Review of Systems  Constitutional: Negative for chills and fever.  Respiratory: Negative for shortness of breath.   Cardiovascular: Negative for chest pain.  Gastrointestinal: Negative for nausea and vomiting.  Skin: Negative for rash.    Physical Exam BP (!) 149/94   Pulse (!) 114   Temp 98.1 F (36.7 C) (Temporal)   Resp 14   Ht 5'  2" (1.575 m)   Wt 134 lb 12.8 oz (61.1 kg)   SpO2 98%   BMI 24.66 kg/m  CONSTITUTIONAL: No acute distress HEENT:  Normocephalic, atraumatic, extraocular motion intact. RESPIRATORY:  Lungs are clear, and breath sounds are equal bilaterally. Normal respiratory effort without pathologic use of accessory muscles. CARDIOVASCULAR: Heart is regular without murmurs, gallops, or rubs. BREAST:  Left breast s/p lumpectomy with well healed scar in upper outer quadrant.  No palpable masses, skin changes, nipple changes.  No left axillary  or supraclavicular lymphadenopathy.  Right breast s/p prior excisional biopsy in upper central portion.  No palpable masses, skin changes, nipple changes.  Prior Port-a-cath site well healed.  No right axillary or supraclavicular lymphadenopathy. NEUROLOGIC:  Motor and sensation is grossly normal.  Cranial nerves are grossly intact. PSYCH:  Alert and oriented to person, place and time. Affect is normal.  Labs/Imaging: Mammogram 08/19/19: FINDINGS: There are no findings suspicious for malignancy. Images were processed with CAD.  IMPRESSION: No mammographic evidence of malignancy.  LEFT lumpectomy changes again noted.  A result letter of this screening mammogram will be mailed directly to the patient.  RECOMMENDATION: Screening mammogram in one year  Assessment and Plan: This is a 73 y.o. female s/p left breast lumpectomy and SLNBx in 09/2010.  --Patient is doing well, without any suspicious findings.  Mammogram 08/19/19 negative.   --Discussed with patient that we can defer future mammograms and exams to her PCP.  If any concerns or suspicious findings come up, we're always here and available to see her. --Follow up prn.  Face-to-face time spent with the patient and care providers was 15 minutes, with more than 50% of the time spent counseling, educating, and coordinating care of the patient.     Melvyn Neth, [facility]

## 2019-09-01 NOTE — Patient Instructions (Addendum)
Please follow up with your primary care physician for your yearly mammograms and breast exams.   Please call our office if you have questions or concerns.

## 2020-07-11 ENCOUNTER — Other Ambulatory Visit: Payer: Self-pay | Admitting: Nurse Practitioner

## 2020-07-11 DIAGNOSIS — Z1231 Encounter for screening mammogram for malignant neoplasm of breast: Secondary | ICD-10-CM

## 2020-08-21 ENCOUNTER — Other Ambulatory Visit: Payer: Self-pay

## 2020-08-21 ENCOUNTER — Ambulatory Visit
Admission: RE | Admit: 2020-08-21 | Discharge: 2020-08-21 | Disposition: A | Discharge status: Home / Self Care | Payer: Medicare Other | Source: Ambulatory Visit | Attending: Nurse Practitioner | Admitting: Nurse Practitioner

## 2020-08-21 DIAGNOSIS — Z1231 Encounter for screening mammogram for malignant neoplasm of breast: Secondary | ICD-10-CM | POA: Diagnosis present

## 2021-07-13 ENCOUNTER — Other Ambulatory Visit: Payer: Self-pay | Admitting: Nurse Practitioner

## 2021-07-13 DIAGNOSIS — Z1231 Encounter for screening mammogram for malignant neoplasm of breast: Secondary | ICD-10-CM

## 2021-08-22 ENCOUNTER — Other Ambulatory Visit: Payer: Self-pay

## 2021-08-22 ENCOUNTER — Ambulatory Visit
Admission: RE | Admit: 2021-08-22 | Discharge: 2021-08-22 | Disposition: A | Discharge status: Home / Self Care | Payer: Medicare Other | Source: Ambulatory Visit | Attending: Nurse Practitioner | Admitting: Nurse Practitioner

## 2021-08-22 DIAGNOSIS — Z1231 Encounter for screening mammogram for malignant neoplasm of breast: Secondary | ICD-10-CM | POA: Diagnosis present

## 2021-10-13 ENCOUNTER — Other Ambulatory Visit: Payer: Self-pay

## 2021-10-13 ENCOUNTER — Encounter: Payer: Self-pay | Admitting: Emergency Medicine

## 2021-10-13 ENCOUNTER — Ambulatory Visit
Admission: EM | Admit: 2021-10-13 | Discharge: 2021-10-13 | Disposition: A | Discharge status: Home / Self Care | Payer: Medicare Other | Attending: Emergency Medicine | Admitting: Emergency Medicine

## 2021-10-13 ENCOUNTER — Ambulatory Visit (INDEPENDENT_AMBULATORY_CARE_PROVIDER_SITE_OTHER): Payer: Medicare Other

## 2021-10-13 DIAGNOSIS — R002 Palpitations: Secondary | ICD-10-CM

## 2021-10-13 DIAGNOSIS — K219 Gastro-esophageal reflux disease without esophagitis: Secondary | ICD-10-CM | POA: Diagnosis not present

## 2021-10-13 LAB — CBC WITH DIFFERENTIAL/PLATELET
Abs Immature Granulocytes: 0.01 10*3/uL (ref 0.00–0.07)
Basophils Absolute: 0 10*3/uL (ref 0.0–0.1)
Basophils Relative: 1 %
Eosinophils Absolute: 0 10*3/uL (ref 0.0–0.5)
Eosinophils Relative: 0 %
HCT: 42.3 % (ref 36.0–46.0)
Hemoglobin: 14.1 g/dL (ref 12.0–15.0)
Immature Granulocytes: 0 %
Lymphocytes Relative: 23 %
Lymphs Abs: 1.2 10*3/uL (ref 0.7–4.0)
MCH: 30.4 pg (ref 26.0–34.0)
MCHC: 33.3 g/dL (ref 30.0–36.0)
MCV: 91.2 fL (ref 80.0–100.0)
Monocytes Absolute: 0.5 10*3/uL (ref 0.1–1.0)
Monocytes Relative: 8 %
Neutro Abs: 3.6 10*3/uL (ref 1.7–7.7)
Neutrophils Relative %: 68 %
Platelets: 222 10*3/uL (ref 150–400)
RBC: 4.64 MIL/uL (ref 3.87–5.11)
RDW: 14.1 % (ref 11.5–15.5)
WBC: 5.3 10*3/uL (ref 4.0–10.5)
nRBC: 0 % (ref 0.0–0.2)

## 2021-10-13 LAB — BASIC METABOLIC PANEL
Anion gap: 5 (ref 5–15)
BUN: 16 mg/dL (ref 8–23)
CO2: 29 mmol/L (ref 22–32)
Calcium: 9.4 mg/dL (ref 8.9–10.3)
Chloride: 103 mmol/L (ref 98–111)
Creatinine, Ser: 0.84 mg/dL (ref 0.44–1.00)
GFR, Estimated: >60 mL/min (ref >60)
Glucose, Bld: 114 mg/dL — ABNORMAL HIGH (ref 70–99)
Potassium: 3.6 mmol/L (ref 3.5–5.1)
Sodium: 137 mmol/L (ref 135–145)

## 2021-10-13 LAB — MAGNESIUM: Magnesium: 2.1 mg/dL (ref 1.7–2.4)

## 2021-10-13 LAB — TSH: TSH: 0.714 u[IU]/mL (ref 0.350–4.500)

## 2021-10-13 LAB — TROPONIN I (HIGH SENSITIVITY): Troponin I (High Sensitivity): <2 ng/L (ref <18)

## 2021-10-13 NOTE — ED Triage Notes (Signed)
Patient c/o indigestion and lower back pain and belching that started this week.  Patient states that she takes Omeprazole for GERD.  Patient states that at night she noticed some "heart palpitations" earlier this week.  Patient denies chest pain. Patient denies SOB.

## 2021-10-13 NOTE — ED Provider Notes (Signed)
HPI  SUBJECTIVE:  Cindy Ball is a 75 y.o. female who presents with 1 week of "stomach upset" and palpitations.  She states the palpitations last about 2 or 3 minutes.  They woke her up out of her sleep today.  She reports diaphoresis.  No nausea, chest pain, pressure, heaviness, dizziness, lightheadedness, syncope, shortness of breath, abdominal pain.  No exertional, positional component.  She states that the symptoms resolved after belching.  There are no aggravating factors.  She has had a cardiac evaluation for palpitations before.  She also reports intermittent episodes of indigestion belching, waterbrash for the past week.  No nausea, abdominal pain, diaphoresis, burning chest pain.  She is eating normally.  She is taking omeprazole, but states it is no longer working.  There are no aggravating or alleviating factors.  It is not associated with eating, exertion.  She denies caffeine use, unintentional weight loss, hemoptysis, surgery in the past 4 weeks, recent immobilization, calf pain or swelling.  She has a past medical history of GERD, borderline diabetes, hypertension, hypercholesterolemia, left breast cancer in 2012.  She does not smoke, has no history of coronary disease, thyroid disease, MI, PE, DVT, BMI above 30.  Family history significant for "thyroid problems" and mother with an MI in her 37s.  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   BREAST EXCISIONAL BIOPSY Bilateral [id number]   benign   BREAST LUMPECTOMY Left 10/04/2010   [facility]   BREAST SURGERY Left 2011   Lumpectomy   CHOLECYSTECTOMY     COLONOSCOPY  2012, Keith Rake, MD   04/27/2008: Adenomatous polyp from the hepatic flexure and a tubular adenoma in the rectum.   COLONOSCOPY WITH PROPOFOL N/A 10/14/2016   Procedure: COLONOSCOPY WITH PROPOFOL;  Surgeon: Manya Silvas, MD;  Location: [facility] ENDOSCOPY;  Service: Endoscopy;  Laterality: N/A;   DILATION AND CURETTAGE OF UTERUS  1970   ESOPHAGOGASTRODUODENOSCOPY (EGD) WITH PROPOFOL N/A 10/14/2016   Procedure: ESOPHAGOGASTRODUODENOSCOPY (EGD) WITH PROPOFOL;  Surgeon: Manya Silvas, MD;  Location: [facility] ENDOSCOPY;  Service: Endoscopy;  Laterality: N/A;   HERNIA REPAIR     mammosite placement  2012   mammosite removal  2012   PORT-A-CATH REMOVAL  01/2012   TUBAL LIGATION     UPPER GI ENDOSCOPY  2005   UPPER GI ENDOSCOPY  04/27/2008   Gaylyn Cheers, M.D.: Moderate active gastritis, H. pylori not identified.    Family History  Problem Relation Age of Onset   Breast cancer Maternal Grandmother 29   Cancer Maternal Grandmother  breast cancer, age greater than 69   Lung cancer Father     Social History   Tobacco Use   Smoking status: Never   Smokeless tobacco: Never  Vaping Use   Vaping Use: Never used  Substance Use Topics   Alcohol use: No   Drug use: No    No current facility-administered medications for this encounter.  Current Outpatient Medications:    amLODipine (NORVASC) 5 MG tablet, TAKE 1 TABLET (5 MG TOTAL) BY MOUTH ONCE DAILY., Disp: , Rfl: 1   metoprolol succinate (TOPROL-XL) 25 MG 24 hr tablet, Take by mouth.,  Disp: , Rfl:    omeprazole (PRILOSEC) 20 MG capsule, TAKE 1 TABLET BY MOUTH TWICE DAILY AS DIRECTED., Disp: , Rfl: 1  Allergies  Allergen Reactions   Clarithromycin Palpitations   Clindamycin/Lincomycin     Pt states she can take     ROS  As noted in HPI.   Physical Exam  BP (!) 146/87 (BP Location: Right Arm)    Pulse (!) 114    Temp 97.8 F (36.6 C) (Oral)    Resp 14    Ht 5\' 2"  (1.575 m)    Wt 58.1 kg    SpO2 98%    BMI 23.41 kg/m   Constitutional: Well developed, well nourished, no acute distress Eyes:  EOMI, conjunctiva normal bilaterally HENT: Normocephalic, atraumatic,mucus membranes moist Neck: No thyroid tenderness, thyromegaly Respiratory: Normal inspiratory effort, lungs clear bilaterally. Cardiovascular: Regular tachycardia, no appreciable murmurs, rubs, or gallops. GI: nondistended soft, nontender, no guarding, rebound.  Active bowel sounds. skin: No rash, skin intact Musculoskeletal: Calves symmetric, nontender, no edema. Neurologic: Alert & oriented x 3, no focal neuro deficits Psychiatric: Speech and behavior appropriate   ED Course   Medications - No data to display  Orders Placed This Encounter  Procedures   DG Chest 2 View    Standing Status:   Standing    Number of Occurrences:   1    Order Specific Question:   Reason for Exam (SYMPTOM  OR DIAGNOSIS REQUIRED)    Answer:   palpitations   CBC with Differential    Standing Status:   Standing    Number of Occurrences:   1   Basic metabolic panel    Standing Status:   Standing    Number of Occurrences:   1   Magnesium    Standing Status:   Standing    Number of Occurrences:   1   TSH    Standing Status:   Standing    Number of Occurrences:   1   Ambulatory referral to Cardiology    Referral Priority:   Routine    Referral Type:   Consultation    Referral Reason:   Specialty Services Required    Requested Specialty:   Cardiology    Number of Visits Requested:   1   ED EKG    Palpitations     Standing Status:   Standing    Number of Occurrences:   1    Order Specific Question:   Reason for Exam    Answer:   Other (See Comments)    Results for orders placed or performed during the hospital encounter of 10/13/21 (from the past 24 hour(s))  CBC with Differential     Status: None   Collection Time: 10/13/21 11:11 AM  Result Value Ref Range   WBC 5.3 4.0 - 10.5 K/uL   RBC 4.64 3.87 - 5.11 MIL/uL  Hemoglobin 14.1 12.0 - 15.0 g/dL   HCT 42.3 36.0 - 46.0 %   MCV 91.2 80.0 - 100.0 fL   MCH 30.4 26.0 - 34.0 pg   MCHC 33.3 30.0 - 36.0 g/dL   RDW 14.1 11.5 - 15.5 %   Platelets 222 150 - 400 K/uL   nRBC 0.0 0.0 - 0.2 %   Neutrophils Relative % 68 %   Neutro Abs 3.6 1.7 - 7.7 K/uL   Lymphocytes Relative 23 %   Lymphs Abs 1.2 0.7 - 4.0 K/uL   Monocytes Relative 8 %   Monocytes Absolute 0.5 0.1 - 1.0 K/uL   Eosinophils Relative 0 %   Eosinophils Absolute 0.0 0.0 - 0.5 K/uL   Basophils Relative 1 %   Basophils Absolute 0.0 0.0 - 0.1 K/uL   Immature Granulocytes 0 %   Abs Immature Granulocytes 0.01 0.00 - 0.07 K/uL  Basic metabolic panel     Status: Abnormal   Collection Time: 10/13/21 11:11 AM  Result Value Ref Range   Sodium 137 135 - 145 mmol/L   Potassium 3.6 3.5 - 5.1 mmol/L   Chloride 103 98 - 111 mmol/L   CO2 29 22 - 32 mmol/L   Glucose, Bld 114 (H) 70 - 99 mg/dL   BUN 16 8 - 23 mg/dL   Creatinine, Ser 0.84 0.44 - 1.00 mg/dL   Calcium 9.4 8.9 - 10.3 mg/dL   GFR, Estimated >60 >60 mL/min   Anion gap 5 5 - 15  Troponin I (High Sensitivity)     Status: None   Collection Time: 10/13/21 11:11 AM  Result Value Ref Range   Troponin I (High Sensitivity) <2 <18 ng/L  Magnesium     Status: None   Collection Time: 10/13/21 11:11 AM  Result Value Ref Range   Magnesium 2.1 1.7 - 2.4 mg/dL  TSH     Status: None   Collection Time: 10/13/21 11:11 AM  Result Value Ref Range   TSH 0.714 0.350 - 4.500 uIU/mL   DG Chest 2 View  Result Date: 10/13/2021 CLINICAL DATA:   Palpitations EXAM: CHEST - 2 VIEW COMPARISON:  November 12, 2010 FINDINGS: The heart size and mediastinal contours are within normal limits. Both lungs are clear. The visualized skeletal structures are unremarkable. IMPRESSION: No active cardiopulmonary disease. Electronically Signed   By: Dorise Bullion III M.D.   On: 10/13/2021 11:51    ED Clinical Impression  1. Palpitations   2. Gastroesophageal reflux disease without esophagitis      ED Assessment/Plan  Patient is reporting indigestion and palpitations,  she has had both before..  She denies chest pain, pressure, heaviness, abdominal pain.  She has had symptoms for a week.  EKG with sinus tachycardia, but no ischemic changes.  However, she was asymptomatic while EKG was obtained.  will check a BMP, magnesium, troponin, TSH and chest x-ray.  Given that she has had symptoms for a week, feel that a single troponin would suffice.  If any of these labs are abnormal, will transfer to the emergency department.  EKG: Sinus tachycardia, rate 102.  Normal axis, normal intervals.  No hypertrophy.  No ST or T wave changes.  Q wave in 3.  No changes compared to EKG from 2011.  Reviewed imaging independently.  Normal chest x-ray, see radiology report for full details.  Labs reviewed.  Slightly elevated glucose.  Otherwise all labs normal.  TSH pending.  will contact patient if TSH is abnormal and needs further  management. Will place referral to cardiology.  Follow-up with PMD and cardiology.  ER return precautions given.   TSH normal.  Plan as above.  Discussed labs, imaging, MDM, treatment plan, and plan for follow-up with patient. Discussed sn/sx that should prompt return to the ED. patient agrees with plan.   No orders of the defined types were placed in this encounter.     *This clinic note was created using Dragon dictation software. Therefore, there may be occasional mistakes despite careful proofreading.  ?    Melynda Ripple,  MD 10/14/21 0730

## 2021-10-13 NOTE — Discharge Instructions (Addendum)
Your labs and x-rays all came back normal.  I will contact you if your thyroid hormone comes back abnormal and needs further management with your primary care provider.  I have placed a referral to cardiology for further evaluation of your palpitations.  Go immediately to the ER if your symptoms get worse, if you get lightheaded, dizzy, pass out, have chest pain, pressure, heaviness, or for any other concerns.

## 2022-07-15 ENCOUNTER — Ambulatory Visit
Admission: RE | Admit: 2022-07-15 | Discharge: 2022-07-15 | Disposition: A | Discharge status: Home / Self Care | Payer: Medicare Other | Attending: Gastroenterology | Admitting: Gastroenterology

## 2022-07-15 ENCOUNTER — Encounter: Payer: Self-pay | Admitting: Anesthesiology

## 2022-07-15 ENCOUNTER — Ambulatory Visit: Payer: Medicare Other | Admitting: Anesthesiology

## 2022-07-15 ENCOUNTER — Encounter
Admission: RE | Disposition: A | Discharge status: Home / Self Care | Payer: Self-pay | Source: Home / Self Care | Attending: Gastroenterology

## 2022-07-15 DIAGNOSIS — Z853 Personal history of malignant neoplasm of breast: Secondary | ICD-10-CM | POA: Diagnosis not present

## 2022-07-15 DIAGNOSIS — K589 Irritable bowel syndrome without diarrhea: Secondary | ICD-10-CM | POA: Insufficient documentation

## 2022-07-15 DIAGNOSIS — K5730 Diverticulosis of large intestine without perforation or abscess without bleeding: Secondary | ICD-10-CM | POA: Insufficient documentation

## 2022-07-15 DIAGNOSIS — Z86010 Personal history of colonic polyps: Secondary | ICD-10-CM | POA: Diagnosis not present

## 2022-07-15 DIAGNOSIS — D127 Benign neoplasm of rectosigmoid junction: Secondary | ICD-10-CM | POA: Insufficient documentation

## 2022-07-15 DIAGNOSIS — E785 Hyperlipidemia, unspecified: Secondary | ICD-10-CM | POA: Insufficient documentation

## 2022-07-15 DIAGNOSIS — Z1211 Encounter for screening for malignant neoplasm of colon: Secondary | ICD-10-CM | POA: Insufficient documentation

## 2022-07-15 DIAGNOSIS — Z79899 Other long term (current) drug therapy: Secondary | ICD-10-CM | POA: Diagnosis not present

## 2022-07-15 DIAGNOSIS — I10 Essential (primary) hypertension: Secondary | ICD-10-CM | POA: Insufficient documentation

## 2022-07-15 DIAGNOSIS — D122 Benign neoplasm of ascending colon: Secondary | ICD-10-CM | POA: Diagnosis not present

## 2022-07-15 DIAGNOSIS — K219 Gastro-esophageal reflux disease without esophagitis: Secondary | ICD-10-CM | POA: Diagnosis not present

## 2022-07-15 DIAGNOSIS — D123 Benign neoplasm of transverse colon: Secondary | ICD-10-CM | POA: Insufficient documentation

## 2022-07-15 HISTORY — PX: ESOPHAGOGASTRODUODENOSCOPY (EGD) WITH PROPOFOL: SHX5813

## 2022-07-15 HISTORY — PX: COLONOSCOPY WITH PROPOFOL: SHX5780

## 2022-07-15 SURGERY — COLONOSCOPY WITH PROPOFOL
Anesthesia: General

## 2022-07-15 MED ORDER — LIDOCAINE HCL (PF) 2 % IJ SOLN
INTRAMUSCULAR | Status: AC
  Filled 2022-07-15: qty 5

## 2022-07-15 MED ORDER — SODIUM CHLORIDE 0.9 % IV SOLN
INTRAVENOUS | Status: DC
  Administered 2022-07-15: 20 mL/h via INTRAVENOUS

## 2022-07-15 MED ORDER — PROPOFOL 1000 MG/100ML IV EMUL
INTRAVENOUS | Status: AC
  Filled 2022-07-15: qty 100

## 2022-07-15 MED ORDER — PROPOFOL 500 MG/50ML IV EMUL
INTRAVENOUS | Status: DC | PRN
  Administered 2022-07-15: 120 ug/kg/min via INTRAVENOUS

## 2022-07-15 MED ORDER — LIDOCAINE HCL (CARDIAC) PF 100 MG/5ML IV SOSY
PREFILLED_SYRINGE | INTRAVENOUS | Status: DC | PRN
  Administered 2022-07-15: 50 mg via INTRAVENOUS

## 2022-07-15 NOTE — Anesthesia Preprocedure Evaluation (Signed)
Anesthesia Evaluation  Patient identified by MRN, date of birth, ID band Patient awake    Reviewed: Allergy & Precautions, NPO status , Patient's Chart, lab work & pertinent test results  Airway Mallampati: II  TM Distance: >3 FB Neck ROM: full    Dental  (+) Teeth Intact, Upper Dentures   Pulmonary neg pulmonary ROS,    Pulmonary exam normal breath sounds clear to auscultation       Cardiovascular Exercise Tolerance: Good hypertension, Pt. on medications negative cardio ROS Normal cardiovascular exam Rhythm:Regular Rate:Normal     Neuro/Psych negative neurological ROS  negative psych ROS   GI/Hepatic negative GI ROS, Neg liver ROS, hiatal hernia, GERD  Medicated,  Endo/Other  negative endocrine ROS  Renal/GU negative Renal ROS  negative genitourinary   Musculoskeletal negative musculoskeletal ROS (+)   Abdominal Normal abdominal exam  (+)   Peds negative pediatric ROS (+)  Hematology negative hematology ROS (+)   Anesthesia Other Findings Past Medical History: 2011: Breast cancer ([place])     Comment:  left breast 2011: Cancer ([place])     Comment:  L-Breast No date: GERD (gastroesophageal reflux disease) No date: Heart murmur 05/6577: Helicobacter pylori infection No date: History of hiatal hernia No date: Hyperlipidemia No date: Hypertension No date: IBS (irritable bowel syndrome) No date: Lump or mass in breast     Comment:  L breast 10/04/2010: Malignant neoplasm of upper-inner quadrant of female  breast ([place])     Comment:  T1b,No.M0; triple negative No date: Malignant neoplasm of upper-outer quadrant of female breast  ([place]) No date: Osteoporosis 2011: Personal history of chemotherapy     Comment:  left breast ca 2011: Personal history of radiation therapy     Comment:  left breast ca No date: Vitamin D deficiency disease  Past Surgical History: 2011: BREAST BIOPSY; Left     Comment:  2011 lesion  showing invasive ductal carcinoma. [id number]: BREAST EXCISIONAL BIOPSY; Bilateral     Comment:  benign 10/04/2010: BREAST LUMPECTOMY; Left     Comment:  [facility]C 2011: BREAST SURGERY; Left     Comment:  Lumpectomy No date: CHOLECYSTECTOMY 2012, Keith Rake, MD: COLONOSCOPY     Comment:  04/27/2008: Adenomatous polyp from the hepatic flexure               and a tubular adenoma in the rectum. 10/14/2016: COLONOSCOPY WITH PROPOFOL; N/A     Comment:  Procedure: COLONOSCOPY WITH PROPOFOL;  Surgeon: Manya Silvas, MD;  Location: [facility] ENDOSCOPY;  Service:               Endoscopy;  Laterality: N/A; 1970: DILATION AND CURETTAGE OF UTERUS 10/14/2016: ESOPHAGOGASTRODUODENOSCOPY (EGD) WITH PROPOFOL; N/A     Comment:  Procedure: ESOPHAGOGASTRODUODENOSCOPY (EGD) WITH               PROPOFOL;  Surgeon: Manya Silvas, MD;  Location: [facility]              ENDOSCOPY;  Service: Endoscopy;  Laterality: N/A; No date: HERNIA REPAIR 2012: mammosite placement 2012: mammosite removal 01/2012: PORT-A-CATH REMOVAL No date: TUBAL LIGATION 2005: UPPER GI ENDOSCOPY 04/27/2008: UPPER GI ENDOSCOPY     Comment:  Gaylyn Cheers, M.D.: Moderate active gastritis, H.               pylori not identified.  BMI    Body Mass Index: 23.41 kg/m      Reproductive/Obstetrics  negative OB ROS                             Anesthesia Physical Anesthesia Plan  ASA: 3  Anesthesia Plan: General   Post-op Pain Management:    Induction: Intravenous  PONV Risk Score and Plan: Propofol infusion and TIVA  Airway Management Planned: Natural Airway  Additional Equipment:   Intra-op Plan:   Post-operative Plan:   Informed Consent: I have reviewed the patients History and Physical, chart, labs and discussed the procedure including the risks, benefits and alternatives for the proposed anesthesia with the patient or authorized representative who has indicated his/her understanding and  acceptance.     Dental Advisory Given  Plan Discussed with: CRNA and Surgeon  Anesthesia Plan Comments:         Anesthesia Quick Evaluation

## 2022-07-15 NOTE — Anesthesia Procedure Notes (Signed)
Date/Time: 07/15/2022 12:30 PM  Performed by: Donalda Ewings, CindyPre-anesthesia Checklist: Patient identified, Emergency Drugs available, Suction available, Patient being monitored and Timeout performed Patient Re-evaluated:Patient Re-evaluated prior to induction Oxygen Delivery Method: Nasal cannula Preoxygenation: Pre-oxygenation with 100% oxygen Induction Type: IV induction Airway Equipment and Method: Bite block Placement Confirmation: positive ETCO2 and CO2 detector

## 2022-07-15 NOTE — Anesthesia Postprocedure Evaluation (Signed)
Anesthesia Post Note  Patient: Cindy Ball  Procedure(s) Performed: COLONOSCOPY WITH PROPOFOL ESOPHAGOGASTRODUODENOSCOPY (EGD) WITH PROPOFOL  Patient location during evaluation: PACU Anesthesia Type: General Level of consciousness: awake and oriented Pain management: satisfactory to patient Vital Signs Assessment: post-procedure vital signs reviewed and stable Respiratory status: nonlabored ventilation and respiratory function stable Cardiovascular status: stable Anesthetic complications: no   No notable events documented.   Last Vitals:  Vitals:   07/15/22 1311 07/15/22 1315  BP: (!) 118/55 124/65  Pulse: 80 84  Resp: 13 (!) 27  Temp:    SpO2: 99% 98%    Last Pain:  Vitals:   07/15/22 1305  TempSrc:   PainSc: Asleep                 VAN STAVEREN,Breylen Agyeman

## 2022-07-15 NOTE — Interval H&P Note (Signed)
History and Physical Interval Note:  07/15/2022 12:18 PM  Cindy Ball  has presented today for surgery, with the diagnosis of HX OF ADENOMATOUS POLYP OF COLON.GERD.  The various methods of treatment have been discussed with the patient and family. After consideration of risks, benefits and other options for treatment, the patient has consented to  Procedure(s): COLONOSCOPY WITH PROPOFOL (N/A) ESOPHAGOGASTRODUODENOSCOPY (EGD) WITH PROPOFOL (N/A) as a surgical intervention.  The patient's history has been reviewed, patient examined, no change in status, stable for surgery.  I have reviewed the patient's chart and labs.  Questions were answered to the patient's satisfaction.     Lesly Rubenstein  Ok to proceed with EGD/Colonoscopy

## 2022-07-15 NOTE — Transfer of Care (Signed)
Immediate Anesthesia Transfer of Care Note  Patient: [name]  Procedure(s) Performed: COLONOSCOPY WITH PROPOFOL ESOPHAGOGASTRODUODENOSCOPY (EGD) WITH PROPOFOL  Patient Location: PACU  Anesthesia Type:MAC and General  Level of Consciousness: awake and sedated  Airway & Oxygen Therapy: Patient Spontanous Breathing and Patient connected to nasal cannula oxygen  Post-op Assessment: Report given to RN and Post -op Vital signs reviewed and stable  Post vital signs: Reviewed and stable  Last Vitals:  Vitals Value Taken Time  BP    Temp    Pulse    Resp    SpO2      Last Pain:  Vitals:   07/15/22 1200  TempSrc: Temporal  PainSc: 0-No pain         Complications: No notable events documented.

## 2022-07-15 NOTE — H&P (Signed)
Outpatient short stay form Pre-procedure 07/15/2022  Lesly Rubenstein, MD  Primary Physician: Sallee Lange, NP  Reason for visit:  GERD/History of polyps  History of present illness:    76 y/o lady with history of hypertension here for EGD/Colonoscopy for GERD and history of polyps. Last colonoscopy about 5-6 years ago with just diverticulosis. No blood thinners. No family history of GI malignancies. No significant abdominal surgeries.    Current Facility-Administered Medications:    0.9 %  sodium chloride infusion, , Intravenous, Continuous, Alanee Ting, Hilton Cork, MD, Last Rate: 20 mL/hr at 07/15/22 1214, Continued from Pre-op at 07/15/22 1214  Medications Prior to Admission  Medication Sig Dispense Refill Last Dose   amLODipine (NORVASC) 5 MG tablet TAKE 1 TABLET (5 MG TOTAL) BY MOUTH ONCE DAILY.  1 07/15/2022 at 0600   metoprolol succinate (TOPROL-XL) 25 MG 24 hr tablet Take by mouth.   07/15/2022 at 0600   omeprazole (PRILOSEC) 20 MG capsule TAKE 1 TABLET BY MOUTH TWICE DAILY AS DIRECTED.  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    Physical  Exam  Gen: Alert, oriented. Appears stated age.  HEENT: PERRLA. Lungs: No respiratory distress CV: RRR Abd: soft, benign, no masses Ext: No edema. Pulses 2+  Planned procedures: Proceed with EGD/colonoscopy. The patient understands the nature of the planned procedure, indications, risks, alternatives and potential complications including but not limited to bleeding, infection, perforation, damage to internal organs and possible oversedation/side effects from anesthesia. The patient agrees and gives consent to proceed.  Please refer to procedure notes for findings, recommendations and patient disposition/instructions.     Lesly Rubenstein, MD [facility]

## 2022-07-15 NOTE — Op Note (Signed)
[facility] Gastroenterology Patient Name: Cindy Ball Procedure Date: 07/15/2022 11:27 AM MRN: [id number] Account #: [id number] Date of Birth: 01/27/1946 Admit Type: Outpatient Age: 76 Room: [facility] ENDO ROOM 3 Gender: Female Note Status: Finalized Instrument Name: Altamese Cabal Endoscope [id number] Procedure:             Upper GI endoscopy Indications:           Gastro-esophageal reflux disease Providers:             Andrey Farmer MD, MD Medicines:             Monitored Anesthesia Care Complications:         No immediate complications. Procedure:             Pre-Anesthesia Assessment:                        - Prior to the procedure, a History and Physical was                         performed, and patient medications and allergies were                         reviewed. The patient is competent. The risks and                         benefits of the procedure and the sedation options and                         risks were discussed with the patient. All questions                         were answered and informed consent was obtained.                         Patient identification and proposed procedure were                         verified by the physician, the nurse, the                         anesthesiologist, the anesthetist and the technician                         in the endoscopy suite. Mental Status Examination:                         alert and oriented. Airway Examination: normal                         oropharyngeal airway and neck mobility. Respiratory                         Examination: clear to auscultation. CV Examination:                         normal. Prophylactic Antibiotics: The patient does not                         require prophylactic antibiotics. Prior  Anticoagulants: The patient has taken no previous                         anticoagulant or antiplatelet agents. ASA Grade                         Assessment: II - A  patient with mild systemic disease.                         After reviewing the risks and benefits, the patient                         was deemed in satisfactory condition to undergo the                         procedure. The anesthesia plan was to use monitored                         anesthesia care (MAC). Immediately prior to                         administration of medications, the patient was                         re-assessed for adequacy to receive sedatives. The                         heart rate, respiratory rate, oxygen saturations,                         blood pressure, adequacy of pulmonary ventilation, and                         response to care were monitored throughout the                         procedure. The physical status of the patient was                         re-assessed after the procedure.                        After obtaining informed consent, the endoscope was                         passed under direct vision. Throughout the procedure,                         the patient's blood pressure, pulse, and oxygen                         saturations were monitored continuously. The Endoscope                         was introduced through the mouth, and advanced to the                         second part of duodenum. The upper GI endoscopy was  accomplished without difficulty. The patient tolerated                         the procedure well. Findings:      The examined esophagus was normal.      The entire examined stomach was normal.      The examined duodenum was normal. Impression:            - Normal esophagus.                        - Normal stomach.                        - Normal examined duodenum.                        - No specimens collected. Recommendation:        - Discharge patient to home.                        - Resume previous diet.                        - Continue present medications.                        - Return  to referring physician as previously                         scheduled. Procedure Code(s):     --- Professional ---                        [phone], Esophagogastroduodenoscopy, flexible,                         transoral; diagnostic, including collection of                         specimen(s) by brushing or washing, when performed                         (separate procedure) Diagnosis Code(s):     --- Professional ---                        K21.9, Gastro-esophageal reflux disease without                         esophagitis CPT copyright 2019 American Medical Association. All rights reserved. The codes documented in this report are preliminary and upon coder review may  be revised to meet current compliance requirements. Andrey Farmer MD, MD 07/15/2022 12:59:47 PM Number of Addenda: 0 Note Initiated On: 07/15/2022 11:27 AM Estimated Blood Loss:  Estimated blood loss: none.      [facility]

## 2022-07-15 NOTE — Op Note (Signed)
[facility] Patient Name: Cindy Ball Procedure Date: 07/15/2022 11:27 AM MRN: [id number] Account #: [id number] Date of Birth: Sep 30, 1946 Admit Type: Outpatient Age: 76 Room: [facility] ENDO ROOM 3 Gender: Female Note Status: Finalized Instrument Name: Peds Colonoscope [id number] Procedure:             Colonoscopy Indications:           Surveillance: Personal history of adenomatous polyps                         on last colonoscopy > 5 years ago Providers:             Andrey Farmer MD, MD Medicines:             Monitored Anesthesia Care Complications:         No immediate complications. Estimated blood loss:                         Minimal. Procedure:             Pre-Anesthesia Assessment:                        - Prior to the procedure, a History and Physical was                         performed, and patient medications and allergies were                         reviewed. The patient is competent. The risks and                         benefits of the procedure and the sedation options and                         risks were discussed with the patient. All questions                         were answered and informed consent was obtained.                         Patient identification and proposed procedure were                         verified by the physician, the nurse, the                         anesthesiologist, the anesthetist and the technician                         in the endoscopy suite. Mental Status Examination:                         alert and oriented. Airway Examination: normal                         oropharyngeal airway and neck mobility. Respiratory                         Examination: clear to auscultation. CV Examination:  normal. Prophylactic Antibiotics: The patient does not                         require prophylactic antibiotics. Prior                         Anticoagulants: The patient has taken no  previous                         anticoagulant or antiplatelet agents. ASA Grade                         Assessment: II - A patient with mild systemic disease.                         After reviewing the risks and benefits, the patient                         was deemed in satisfactory condition to undergo the                         procedure. The anesthesia plan was to use monitored                         anesthesia care (MAC). Immediately prior to                         administration of medications, the patient was                         re-assessed for adequacy to receive sedatives. The                         heart rate, respiratory rate, oxygen saturations,                         blood pressure, adequacy of pulmonary ventilation, and                         response to care were monitored throughout the                         procedure. The physical status of the patient was                         re-assessed after the procedure.                        After obtaining informed consent, the colonoscope was                         passed under direct vision. Throughout the procedure,                         the patient's blood pressure, pulse, and oxygen                         saturations were monitored continuously. The  Colonoscope was introduced through the anus and                         advanced to the the cecum, identified by appendiceal                         orifice and ileocecal valve. The colonoscopy was                         performed without difficulty. The patient tolerated                         the procedure well. The quality of the bowel                         preparation was good. Findings:      The perianal and digital rectal examinations were normal.      Two sessile polyps were found in the ascending colon. The polyps were 2       to 4 mm in size. These polyps were removed with a cold snare. Resection       and retrieval were  complete. Estimated blood loss was minimal.      A 1 mm polyp was found in the hepatic flexure. The polyp was sessile.       The polyp was removed with a jumbo cold forceps. Resection and retrieval       were complete. Estimated blood loss was minimal.      A 3 mm polyp was found in the hepatic flexure. The polyp was sessile.       The polyp was removed with a cold snare. Resection and retrieval were       complete. Estimated blood loss was minimal.      A 1 mm polyp was found in the rectum. The polyp was sessile. The polyp       was removed with a jumbo cold forceps. Resection and retrieval were       complete. Estimated blood loss was minimal.      Multiple small-mouthed diverticula were found in the sigmoid colon and       descending colon.      The exam was otherwise without abnormality on direct and retroflexion       views. Impression:            - Two 2 to 4 mm polyps in the ascending colon, removed                         with a cold snare. Resected and retrieved.                        - One 1 mm polyp at the hepatic flexure, removed with                         a jumbo cold forceps. Resected and retrieved.                        - One 3 mm polyp at the hepatic flexure, removed with                         a cold  snare. Resected and retrieved.                        - One 1 mm polyp in the rectum, removed with a jumbo                         cold forceps. Resected and retrieved.                        - Diverticulosis in the sigmoid colon and in the                         descending colon.                        - The examination was otherwise normal on direct and                         retroflexion views. Recommendation:        - Discharge patient to home.                        - Resume previous diet.                        - Continue present medications.                        - Await pathology results.                        - Repeat colonoscopy is not recommended due to  current                         age (18 years or older) for surveillance.                        - Return to referring physician as previously                         scheduled. Procedure Code(s):     --- Professional ---                        [id number], Colonoscopy, flexible; with removal of                         tumor(s), polyp(s), or other lesion(s) by snare                         technique                        45380, 56, Colonoscopy, flexible; with biopsy, single                         or multiple Diagnosis Code(s):     --- Professional ---                        Z86.010, Personal history of colonic polyps  K63.5, Polyp of colon                        K62.1, Rectal polyp                        K57.30, Diverticulosis of large intestine without                         perforation or abscess without bleeding CPT copyright 2019 American Medical Association. All rights reserved. The codes documented in this report are preliminary and upon coder review may  be revised to meet current compliance requirements. Andrey Farmer MD, MD 07/15/2022 1:02:46 PM Number of Addenda: 0 Note Initiated On: 07/15/2022 11:27 AM Scope Withdrawal Time: 0 hours 12 minutes 1 second  Total Procedure Duration: 0 hours 16 minutes 36 seconds  Estimated Blood Loss:  Estimated blood loss was minimal.      [facility]

## 2022-07-16 ENCOUNTER — Encounter: Payer: Self-pay | Admitting: Gastroenterology

## 2022-07-16 LAB — SURGICAL PATHOLOGY

## 2022-07-23 ENCOUNTER — Other Ambulatory Visit: Payer: Self-pay | Admitting: Nurse Practitioner

## 2022-07-23 DIAGNOSIS — Z1231 Encounter for screening mammogram for malignant neoplasm of breast: Secondary | ICD-10-CM

## 2022-08-23 ENCOUNTER — Ambulatory Visit
Admission: RE | Admit: 2022-08-23 | Discharge: 2022-08-23 | Disposition: A | Discharge status: Home / Self Care | Payer: Medicare Other | Source: Ambulatory Visit | Attending: Nurse Practitioner | Admitting: Nurse Practitioner

## 2022-08-23 DIAGNOSIS — Z1231 Encounter for screening mammogram for malignant neoplasm of breast: Secondary | ICD-10-CM | POA: Diagnosis present

## 2022-12-21 IMAGING — CR DG CHEST 2V
2 series · 2 of 2 positions shown · non-contrast
Comparison: November 12, 2010

CLINICAL DATA: Palpitations

EXAM:
CHEST - 2 VIEW

[chest pa]
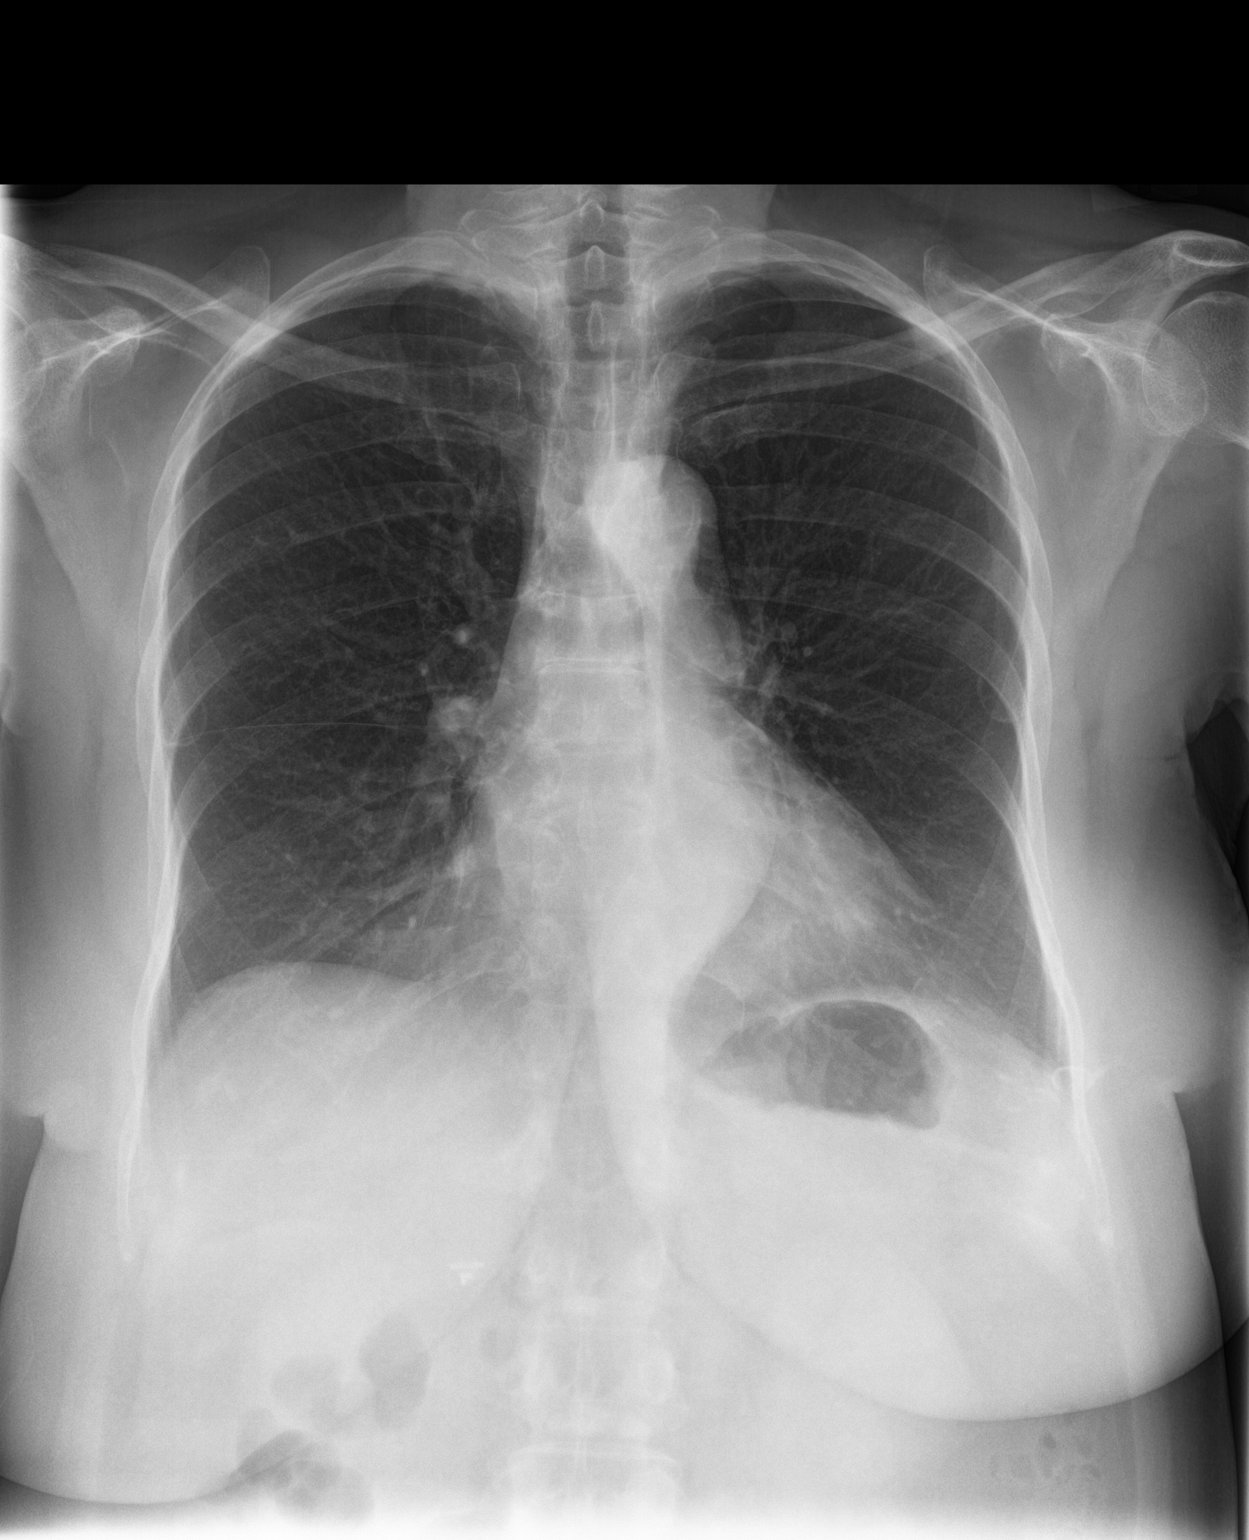

[chest lat]
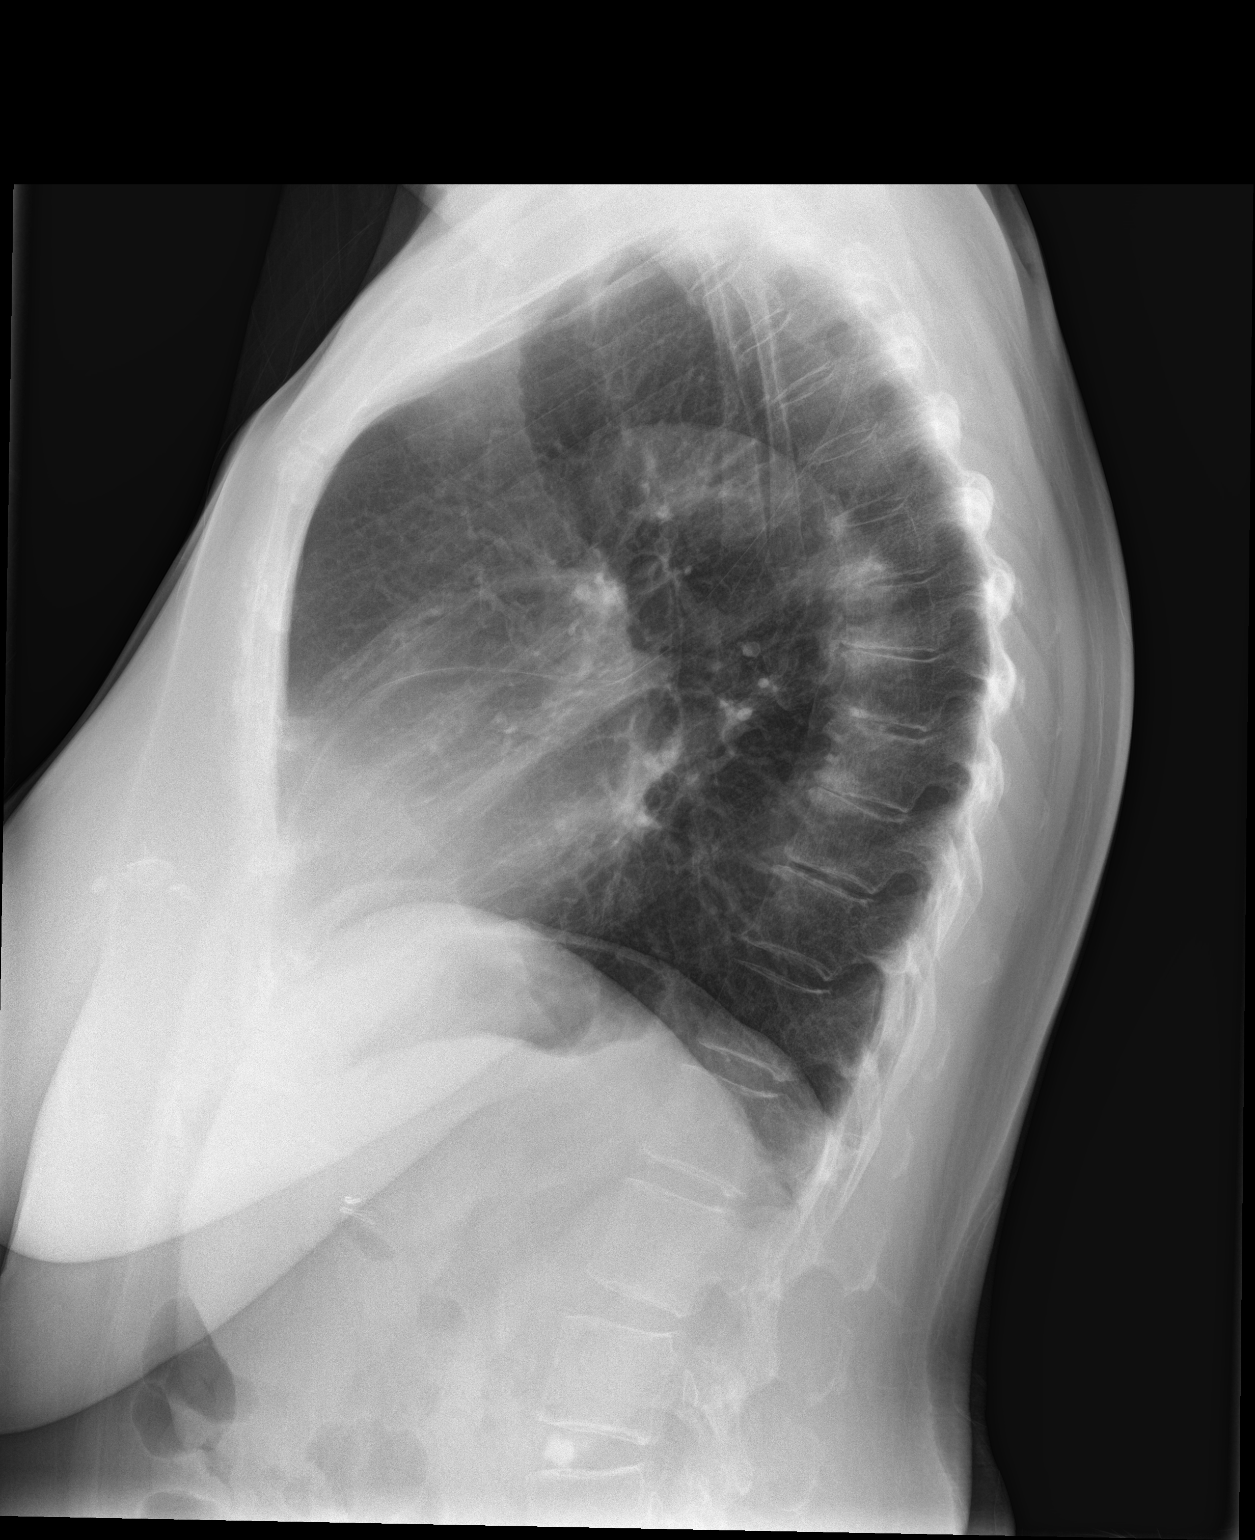

[2 of 2 positions shown; findings below may reference images not displayed]

FINDINGS: The heart size and mediastinal contours are within normal limits.
Both lungs are clear. The visualized skeletal structures are
unremarkable.
IMPRESSION: No active cardiopulmonary disease.

## 2023-07-22 ENCOUNTER — Other Ambulatory Visit: Payer: Self-pay | Admitting: Nurse Practitioner

## 2023-07-22 DIAGNOSIS — Z1231 Encounter for screening mammogram for malignant neoplasm of breast: Secondary | ICD-10-CM

## 2023-08-26 ENCOUNTER — Ambulatory Visit
Admission: RE | Admit: 2023-08-26 | Discharge: 2023-08-26 | Disposition: A | Discharge status: Home / Self Care | Payer: Medicare Other | Source: Ambulatory Visit | Attending: Nurse Practitioner | Admitting: Nurse Practitioner

## 2023-08-26 DIAGNOSIS — Z1231 Encounter for screening mammogram for malignant neoplasm of breast: Secondary | ICD-10-CM | POA: Diagnosis present

## 2024-06-07 ENCOUNTER — Other Ambulatory Visit: Payer: Self-pay | Admitting: Nurse Practitioner

## 2024-06-07 DIAGNOSIS — Z1231 Encounter for screening mammogram for malignant neoplasm of breast: Secondary | ICD-10-CM

## 2024-08-26 ENCOUNTER — Ambulatory Visit
Admission: RE | Admit: 2024-08-26 | Discharge: 2024-08-26 | Disposition: A | Discharge status: Home / Self Care | Source: Ambulatory Visit | Attending: Nurse Practitioner | Admitting: Nurse Practitioner

## 2024-08-26 DIAGNOSIS — Z1231 Encounter for screening mammogram for malignant neoplasm of breast: Secondary | ICD-10-CM | POA: Diagnosis present
